# Patient Record
Sex: Male | Born: 1945 | Race: White | Hispanic: No | Marital: Married | Smoking: Never smoker
Health system: Southern US, Community
[De-identification: ages and names within clinical notes are randomized; demographics above are authoritative.]

---

## 2013-09-19 ENCOUNTER — Ambulatory Visit: Payer: Self-pay | Admitting: Family Medicine

## 2014-05-24 IMAGING — CR DG CHEST 2V
1 series · 2 of 2 positions shown · non-contrast
Comparison: none

REASON FOR EXAM: cough persistent cough for 4wks and sounds in chest
COMMENTS:

[Series 1: pa · 0.17mm/px · 2 of 2 slices shown]
[im 1/2]
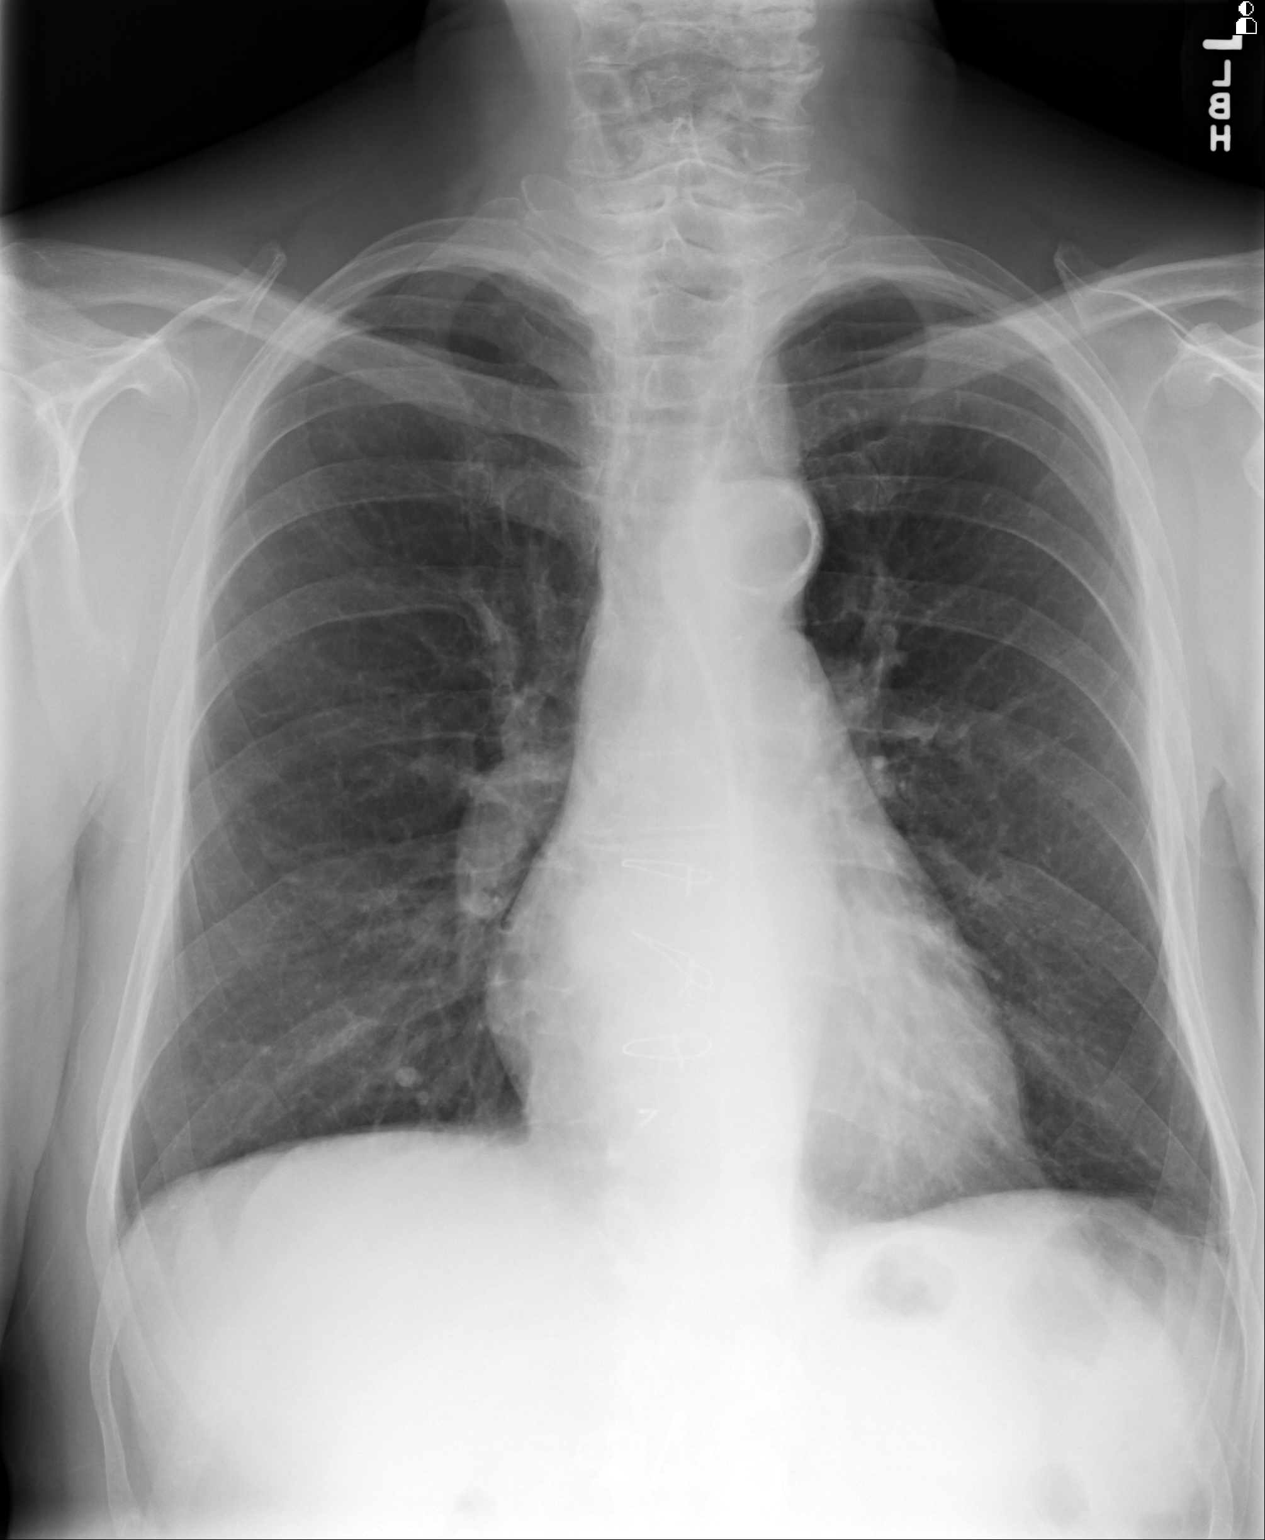
[im 2/2]
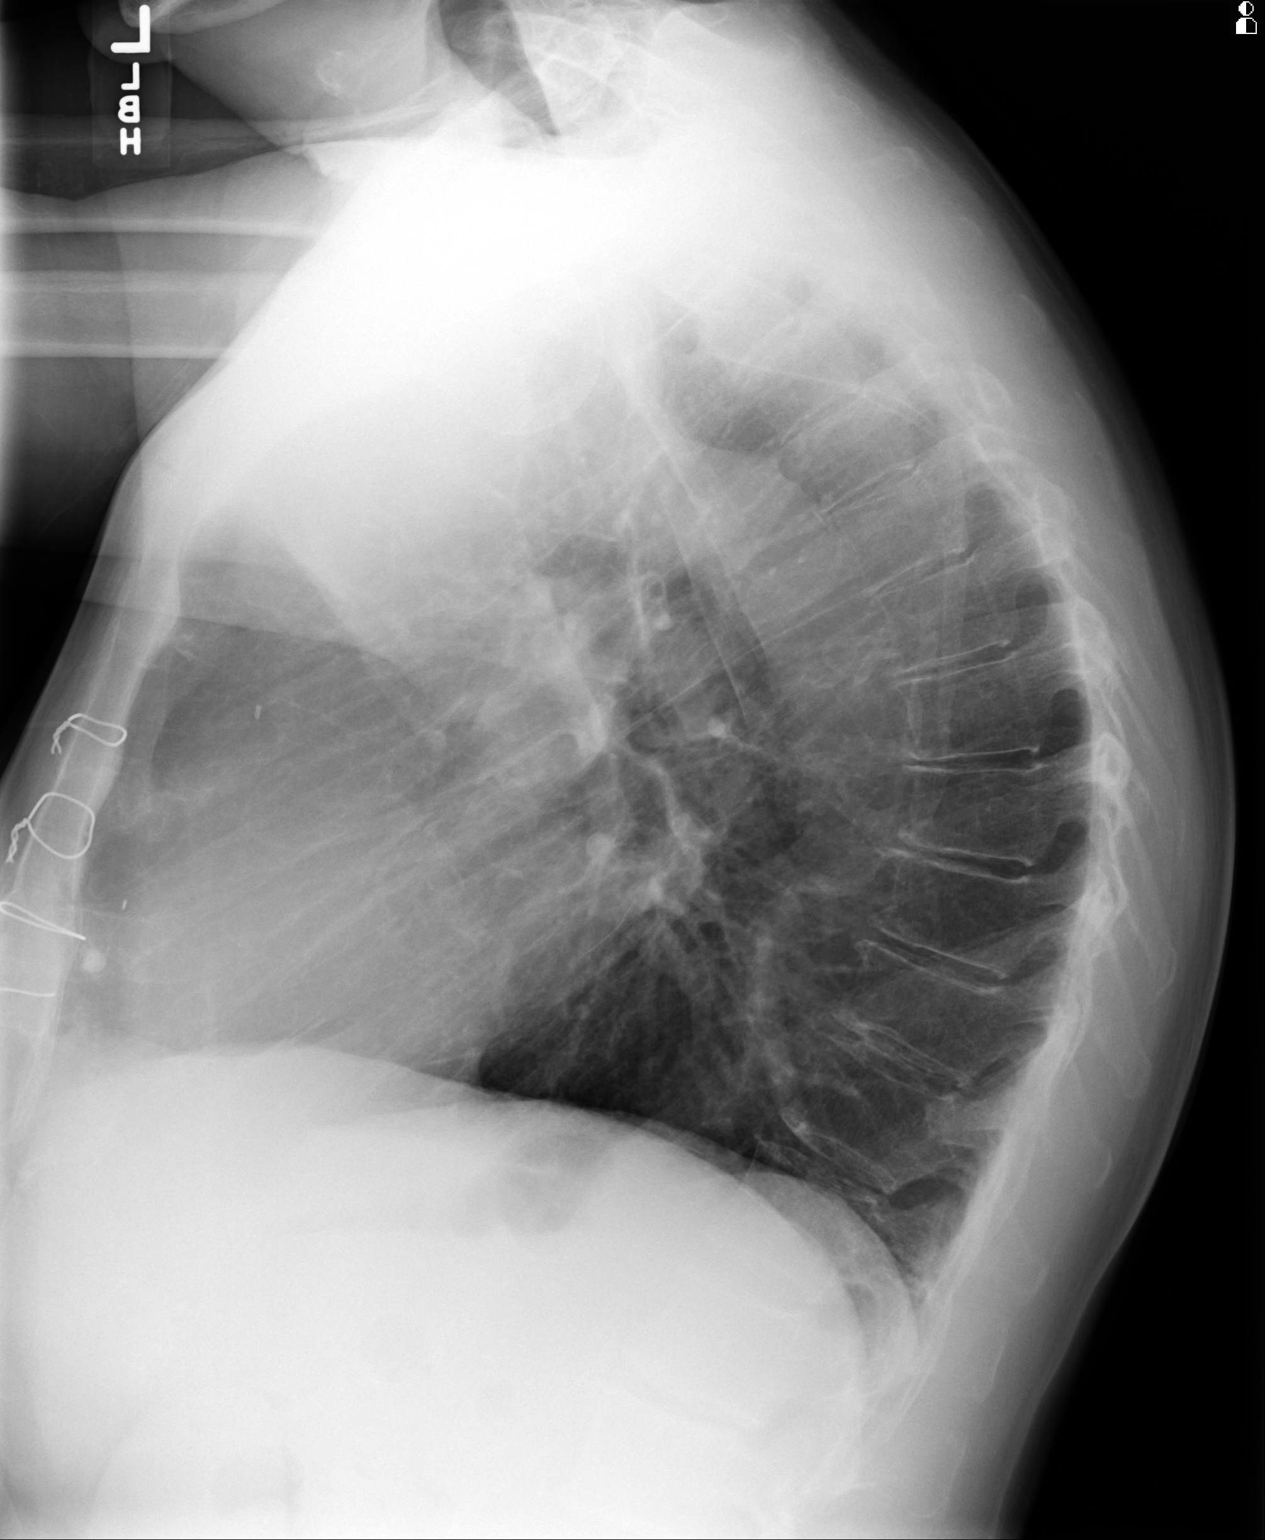

[2 of 2 positions shown; findings below may reference images not displayed]

PROCEDURE:     MDR - MDR CHEST PA(OR AP) AND LATERAL  - September 19, 2013  [DATE]

RESULT:     The lungs are mildly hyperinflated and clear. The cardiac
silhouette is normal in size. The mediastinum is normal in width. There is
no pleural effusion or pneumothorax. The bony thorax exhibits no acute
abnormality where visualized. The patient has undergone previous median
sternotomy.
IMPRESSION: There is mild hyperinflation which may be voluntary or
could reflect underlying COPD or reactive airway disease. There is no
evidence of pneumonia nor CHF.

[REDACTED]

## 2017-08-12 ENCOUNTER — Ambulatory Visit: Payer: Medicare HMO | Attending: Internal Medicine

## 2017-08-12 DIAGNOSIS — M79642 Pain in left hand: Secondary | ICD-10-CM | POA: Insufficient documentation

## 2017-08-12 DIAGNOSIS — M79641 Pain in right hand: Secondary | ICD-10-CM | POA: Insufficient documentation

## 2017-08-12 DIAGNOSIS — M542 Cervicalgia: Secondary | ICD-10-CM | POA: Diagnosis not present

## 2017-08-12 NOTE — Therapy (Signed)
[facility] PHYS[facility]ICINE [address] [place], [place], [address] Phone: [phone]   Fax:  [phone]  Physical Therapy Evaluation  Patient Details  Name: Benjamin [name] MRN: [id number] Date of Birth: 29-Apr-1946 Referring Provider: Dr. [place] BlasMoodey  Encounter Date: 08/12/2017      PT End of Session - 08/12/17 0942    Visit Number 1   Number of Visits 13   Date for PT Re-Evaluation 09/23/17   PT Start Time 0830   PT Stop Time 0930   PT Time Calculation (min) 60 min   Activity Tolerance Patient tolerated treatment well   Behavior [facility] assessed/performed      History reviewed. No pertinent past medical history.  History reviewed. No pertinent surgical history.  There were no vitals filed for this visit.      Subjective Assessment - 08/12/17 0846    Subjective Patienty reports increased pain along the L side of the neck and numbness and tingling into the R & L hand (digits 1-3). Patient reports aggravation in symptoms with typing, gardening, sleeping (can only sleep on L side), and grabbing cold items. Patient reports decreased in symptoms with heat, yoga, and cervical extension. Patient reports incresaed pain at night. Patient reports slight worsening of symptoms since onset but has recently started to improve after applying voltaren cream.    Pertinent History PMH: x3 mitral valve replacement, history of anemia,    Limitations Lifting   How long can you sit comfortably? Driving: increase in pain after driving 50 miles   Diagnostic tests X-Ray: shortening of spaces   Currently in Pain? Yes   Pain Score 0-No pain  worst: 8/10; best 0/10   Pain Location Neck   Pain Orientation Left;Right   Pain Descriptors / Indicators Numbness;Burning  Dull Ache in the neck   Pain Type Chronic pain   Pain Radiating Towards Into B digits 1-3 on the R & L   Pain Onset More than a month ago   Pain Frequency Intermittent             [facility] PT Assessment - 08/12/17 0841      Assessment   Medical Diagnosis Cervical radiculopathy   Referring Provider Dr. [place] BlasMoodey   Onset Date/Surgical Date 01/29/17   Hand Dominance Right   Next MD Visit 09/08/2017   Prior Therapy yes     Balance Screen   Has the patient fallen in the past 6 months No   Has the patient had a decrease in activity level because of a fear of falling?  No   Is the patient reluctant to leave their home because of a fear of falling?  No     Home [employer]vironment   Living Environment Private residence   Living Arrangements Spouse/significant other   Available Help at Discharge Family   Type of Home House   Home Access Stairs to enter   Entrance Stairs-Number of Steps 7   Entrance Stairs-Rails Can reach both   Home Layout Two level   Alternate Level Stairs-Number of Steps 15   Alternate Level Stairs-Rails Can reach both     Prior Function   Level of Independence Independent   Vocation Full time employment   Vocation Requirements typing, driving   Leisure Garden, theature     Cognition   Overall Cognitive Status Within Functional Limits for tasks assessed     Observation/Other Assessments   Observations Poor scapular retraction mobility  Other Surveys  Other Surveys   Neck Disability Index  6%     Sensation   Light Touch Appears Intact     Posture/Postural Control   Posture Comments Increased thoracic kyphosis, forward rounded shoulders, FHP     ROM / Strength   AROM / PROM / Strength AROM;Strength     AROM   AROM Assessment Site Cervical;Thoracic  Shoulder WNL   Cervical Flexion 50   Cervical Extension 30   Cervical - Right Side Bend 20  pain on the L   Cervical - Left Side Bend 20   Cervical - Right Rotation 55   Cervical - Left Rotation 35   Thoracic Flexion WNL   Thoracic Extension 50% limited     Strength   Strength Assessment Site Cervical   Cervical Flexion 4+/5   Cervical Extension 4-/5   Cervical - Right  Side Bend 4-/5   Cervical - Left Side Bend 4-/5     Palpation   Spinal mobility L Sided: hypomobility unilat C3-T1   Palpation comment Increased TTP along the L upper trap     Special Tests    Special Tests Cervical   Cervical Tests Spurling's     Spurling's   Findings Negative     Median Nerve ULTT: Increased onset of symptoms on the L with extending the elbow   TREATMENT: Therapeutic Exercise Scapular retraction in sitting -- x 20  Median Nerve Sliders -- x 20 B Cervical retraction in sitting -- x 20 B    Patient demonstrates improved scapular mobility after performing greater amount of repetitions       PT Education - 08/12/17 0942    Education provided Yes   Education Details HEP: scapular retraction, cervical retraction, median nerve sliders   Person(s) Educated Patient   Methods Explanation;Demonstration;Handout   Comprehension Verbalized understanding;Returned demonstration             PT Long Term Goals - 08/12/17 1327      PT LONG TERM GOAL #1   Title Patient will be independent with HEP performance to continue benefit of therapy after discharge   Baseline Dependent with exercise performance and technique   Time 6   Period Weeks   Status New   Target Date 09/23/17     PT LONG TERM GOAL #2   Title Patient will improve NDI to 0% to improve self- perceived neck dysfunction and decrease neck pain with activity.   Baseline NDI: 6%   Time 6   Period Weeks   Status New   Target Date 09/23/17     PT LONG TERM GOAL #3   Title Patient will have a worst pain of 0/10 in his neck and hands to improve resting comfort at home most notably at the end of the day.   Baseline 8/10 in his hands   Time 6   Period Weeks   Status New   Target Date 09/23/17               Plan - 08/12/17 0943    Clinical Impression Statement Patient is a 71 yo right hand dominant male presenting with increased L sided shoulder pain with radiating symptoms into the 1-3  digits bilaterally. Patient demonstrates increased cervical pain and numbness/tingling/burning with performance of manipulation of small objects, driving, typing, and positional changes. These changes have caused increased difficulty with job performance and sleeping. Patient demonstrates decreased AROM and mobility in the cervical spine and pt will benefit from further  skilled therapy to return to prior level of function.    History and Personal Factors relevant to plan of care: Chronic pain, radiating pain, increased demand of driving   Clinical Presentation Stable   Clinical Presentation due to: Patient demonstrates no increase in pain   Clinical Decision Making Moderate   Rehab Potential Fair   Clinical Impairments Affecting Rehab Potential (+) highly motivated, (-) chronicity of pain   PT Frequency 2x / week   PT Duration 6 weeks   PT Treatment/Interventions Gait training;Electrical Stimulation;Iontophoresis /ml Dexamethasone;ADLs/Self Care Home Management;Ultrasound;Moist Heat;Balance training;Therapeutic exercise;Fluidtherapy;Therapeutic activities;Functional mobility training;Stair training;Neuromuscular re-education;Patient/family education;Passive range of motion;Manual techniques;Dry needling   PT Next Visit Plan Progress strengthening and AROM   PT Home Exercise Plan see education section   Consulted and Agree with Plan of Care Patient      Patient will benefit from skilled therapeutic intervention in order to improve the following deficits and impairments:  Pain, Decreased mobility, Decreased coordination, Increased muscle spasms, Decreased endurance, Decreased range of motion, Decreased strength, Decreased activity tolerance, Decreased balance, Hypomobility  Visit Diagnosis: Cervicalgia - Plan: PT plan of care cert/re-cert  Pain in left hand - Plan: PT plan of care cert/re-cert  Pain in right hand - Plan: PT plan of care cert/re-cert       G-Codes - 08/18/17 1339     Functional Assessment Tool Used (Outpatient Only) NDI, AROM, MMT    Functional Limitation Changing and maintaining body position   Changing and Maintaining Body Position Current Status (Z6109) At least 1 percent but less than 20 percent impaired, limited or restricted   Changing and Maintaining Body Position Goal Status (U0454) At least 1 percent but less than 20 percent impaired, limited or restricted      Problem List There are no active problems to display for this patient.   Myrene Galas, PT DPT 08/18/2017, 4:58 PM   [facility] PHYSICAL AND SPORTS MEDICINE [address], [place], [address] Phone: [phone]   Fax:  [phone]  Name: Benjamin [name] MRN: [id number] Date of Birth: Sep 16, 1946

## 2017-08-17 ENCOUNTER — Ambulatory Visit: Payer: Medicare HMO

## 2017-08-17 DIAGNOSIS — M542 Cervicalgia: Secondary | ICD-10-CM | POA: Diagnosis not present

## 2017-08-17 DIAGNOSIS — M79641 Pain in right hand: Secondary | ICD-10-CM

## 2017-08-17 DIAGNOSIS — M79642 Pain in left hand: Secondary | ICD-10-CM

## 2017-08-17 NOTE — Therapy (Signed)
[facility] PHYS[facility]ORTS MEDICINE [address] [place], [address] Phone: [phone]   Fax:  [phone]  Physical Therapy Treatment  Patient Details  Name: Benjamin [name] MRN: [id number] Date of Birth: 05-23-1946 Referring Provider: Dr.  Blas  Encounter Date: 08/17/2017      PT End of Session - 08/17/17 0858    Visit Number 2   Number of Visits 13   Date for PT Re-Evaluation 09/23/17   PT Start Time 0827   PT Stop Time 0915   PT Time Calculation (min) 48 min   Activity Tolerance Patient tolerated treatment well   Behavior During Therapy [facility] for tasks assessed/performed      History reviewed. No pertinent past medical history.  History reviewed. No pertinent surgical history.  There were no vitals filed for this visit.      Subjective Assessment - 08/17/17 0828    Subjective Patinet reports he's been performing his HEP. Patient states the pain has been mild and comes and goes.    Pertinent History PMH: x3 mitral valve replacement, history of anemia,    How long can you sit comfortably? Driving: increase in pain after driving 50 miles   Diagnostic tests X-Ray: shortening of spaces   Currently in Pain? No/denies      TREATMENT: Manual Therapy:  STM to patient's L upper traps and cervical extensor musculature with patient positioned in sitting to decrease increased pain and spasms  Therapeutic Exercise: Overhead shoulder ADD with YTB - 2 x 20  Sitting shoulder ER with YTB - 2 x 20  Standing scapular retraction with TRX - 2 x 20  Standing scapular retraction at OMEGA - 2 x 20 #15 Standing straight arm push downs at OMEGA - 2 x 20 15# Seated High row at [facility] - 2 x 20 25# Seated thoracic extension over the towel - 2 x 20 with feet supported on step Shoulder flexion with yellow physioball - x 20  Push up PLUS at wall with hand at wall - x 20          PT Education - 08/17/17 0838    Education provided Yes   Education Details form/technique with exercise   Person(s) Educated Patient   Methods Explanation;Demonstration   Comprehension Verbalized understanding;Returned demonstration             PT Long Term Goals - 08/12/17 1327      PT LONG TERM GOAL #1   Title Patient will be independent with HEP performance to continue benefit of therapy after discharge   Baseline Dependent with exercise performance and technique   Time 6   Period Weeks   Status New   Target Date 09/23/17     PT LONG TERM GOAL #2   Title Patient will improve NDI to 0% to improve self- perceived neck dysfunction and decrease neck pain with activity.   Baseline NDI: 6%   Time 6   Period Weeks   Status New   Target Date 09/23/17     PT LONG TERM GOAL #3   Title Patient will have a worst pain of 0/10 in his neck and hands to improve resting comfort at home most notably at the end of the day.   Baseline 8/10 in his hands   Time 6   Period Weeks   Status New   Target Date 09/23/17               Plan - 08/17/17 6962  Clinical Impression Statement Patient demonstrates poor motor control with scapular/cervical motion requiring frequent cueing on proper positioning throughout session. Patient demonstrates improvement with radicular symptoms after performing AROM exercises indicating joint dysfunction. Patient will benefit from further skilled therapy to return to prior level of function.    Rehab Potential Fair   Clinical Impairments Affecting Rehab Potential (+) highly motivated, (-) chronicity of pain   PT Frequency 2x / week   PT Duration 6 weeks   PT Treatment/Interventions Gait training;Electrical Stimulation;Iontophoresis /ml Dexamethasone;ADLs/Self Care Home Management;Ultrasound;Moist Heat;Balance training;Therapeutic exercise;Fluidtherapy;Therapeutic activities;Functional mobility training;Stair training;Neuromuscular re-education;Patient/family education;Passive range of motion;Manual  techniques;Dry needling   PT Next Visit Plan Progress strengthening and AROM   PT Home Exercise Plan see education section   Consulted and Agree with Plan of Care Patient      Patient will benefit from skilled therapeutic intervention in order to improve the following deficits and impairments:  Pain, Decreased mobility, Decreased coordination, Increased muscle spasms, Decreased endurance, Decreased range of motion, Decreased strength, Decreased activity tolerance, Decreased balance, Hypomobility  Visit Diagnosis: Cervicalgia  Pain in left hand  Pain in right hand     Problem List There are no active problems to display for this patient.   Myrene Galas, PT DPT 08/17/2017, 9:20 AM  [place] [facility] PHYSICAL AND SPORTS MEDICINE [address] [place], [address] Phone: [phone]   Fax:  [phone]  Name: Benjamin [name] MRN: [id number] Date of Birth: August 18, 1946

## 2017-08-18 ENCOUNTER — Ambulatory Visit: Payer: Medicare HMO

## 2017-08-19 ENCOUNTER — Ambulatory Visit: Payer: Medicare HMO

## 2017-08-20 ENCOUNTER — Ambulatory Visit: Payer: Medicare HMO

## 2017-08-20 DIAGNOSIS — M79642 Pain in left hand: Secondary | ICD-10-CM

## 2017-08-20 DIAGNOSIS — M79641 Pain in right hand: Secondary | ICD-10-CM

## 2017-08-20 DIAGNOSIS — M542 Cervicalgia: Secondary | ICD-10-CM

## 2017-08-20 NOTE — Therapy (Signed)
Pt enters/exits pool via steps Participates in exercises as follows: 4 laps fwd walk with kick boards under arms for support, focus on upright posture, head aligned, core strength Core strength and upper body range/strength with: - shoulder press downs, green dum bells, 20x - horizontal abd/add, 20x - Shoulder flex/ext, 20x - shoulder abd/add, 20x - bkwd shoulder rolls, focus on back and down with hold, 20x - single arm figure 8's, 20x each - small arm circles, fwd/bkwd, 20x ea Stretching: -B upper trap, 3 x 15 sec each -B SCM/rotation, 3 x 15 sec each

## 2017-08-25 ENCOUNTER — Ambulatory Visit: Payer: Medicare HMO

## 2017-08-26 ENCOUNTER — Ambulatory Visit: Payer: Medicare HMO

## 2017-08-26 DIAGNOSIS — M79641 Pain in right hand: Secondary | ICD-10-CM

## 2017-08-26 DIAGNOSIS — M542 Cervicalgia: Secondary | ICD-10-CM | POA: Diagnosis not present

## 2017-08-26 DIAGNOSIS — M79642 Pain in left hand: Secondary | ICD-10-CM

## 2017-08-26 NOTE — Therapy (Signed)
[facility] PHYSICAL [facility] SPORTS MEDICINE [address] [place], [address] Phone: [phone]   Fax:  [phone]  Physical Therapy Treatment  Patient Details  Name: Benjamin [name] MRN: [id number] Date of Birth: 07-13-1946 Referring Provider: Dr. [place] Blas  Encounter Date: 08/26/2017      PT End of Session - 08/26/17 0838    Visit Number 4   Number of Visits 13   Date for PT Re-Evaluation 09/23/17   PT Start Time 0830   PT Stop Time 0915   PT Time Calculation (min) 45 min   Activity Tolerance Patient tolerated treatment well   Behavior During Therapy [facility] for tasks assessed/performed      History reviewed. No pertinent past medical history.  History reviewed. No pertinent surgical history.  There were no vitals filed for this visit.      Subjective Assessment - 08/26/17 0835    Subjective Pt notes improvement with land and water therapy and states his pain and symptoms has 90% improvement.    Currently in Pain? No/denies      TREATMENT:   Therapeutic Exercise: Standing straight arm push downs at OMEGA - 2 x 20 15# Seated High row at OMEGA - 2 x 20 25# Standing Scapular retraction at OMEGA - 2 x 20 15# Standing scapular retraction with TRX - 2 x 20  Standing Shoulder extension with TRX -  2 x 10  Corner Stretch to improve scapular ROM - x20 with 2 sec holds Wall angels while facing wall and back against wall  - x 10 Seated prayer stretch with clear physioball - x 15 Scapular retraction in standing at doorway -- 2x20 Serratus punches at wall -- x 20 with manual/tactile cues   Patient demonstrates increased fatigue at end of therapy        PT Education - 08/26/17 0838    Education provided Yes   Education Details form/technique with exercise   Person(s) Educated Patient   Methods Explanation;Demonstration   Comprehension Verbalized understanding;Returned demonstration             PT Long Term Goals - 08/20/17  1421      PT LONG TERM GOAL #1   Title Patient will be independent with HEP performance to continue benefit of therapy after discharge   Baseline Dependent with exercise performance and technique     PT LONG TERM GOAL #2   Title Patient will improve NDI to 0% to improve self- perceived neck dysfunction and decrease neck pain with activity.   Baseline NDI: 6%   Time 6   Period Weeks   Status New     PT LONG TERM GOAL #3   Title Patient will have a worst pain of 0/10 in his neck and hands to improve resting comfort at home most notably at the end of the day.   Baseline 8/10 in his hands   Time 6   Period Weeks   Status New               Plan - 08/26/17 2536    Clinical Impression Statement Continued to focus on improving scapular and cervical strengthening and coordination today with exercise. Patient demonstrates improvement with exercise performance with ability to tolarate advancement in exercise without increase in symptoms. Patient continues to require minimal requirement on shoulder positioning indcating decreased motor control and patient will benefit from further skilled therapy to return to prior level of function.    Rehab Potential Fair  Clinical Impairments Affecting Rehab Potential (+) highly motivated, (-) chronicity of pain   PT Frequency 2x / week   PT Duration 6 weeks   PT Treatment/Interventions Gait training;Electrical Stimulation;Iontophoresis /ml Dexamethasone;ADLs/Self Care Home Management;Ultrasound;Moist Heat;Balance training;Therapeutic exercise;Fluidtherapy;Therapeutic activities;Functional mobility training;Stair training;Neuromuscular re-education;Patient/family education;Passive range of motion;Manual techniques;Dry needling   PT Next Visit Plan Progress strengthening and AROM   PT Home Exercise Plan see education section   Consulted and Agree with Plan of Care Patient      Patient will benefit from skilled therapeutic intervention in order to  improve the following deficits and impairments:  Pain, Decreased mobility, Decreased coordination, Increased muscle spasms, Decreased endurance, Decreased range of motion, Decreased strength, Decreased activity tolerance, Decreased balance, Hypomobility  Visit Diagnosis: Pain in left hand  Cervicalgia  Pain in right hand     Problem List There are no active problems to display for this patient.   Myrene Galas, PT DPT 08/26/2017, 9:03 AM  [place] [facility] PHYSICAL AND SPORTS MEDICINE [address] [place], [address] Phone: [phone]   Fax:  [phone]  Name: ASEEM SESSUMS MRN: [id number] Date of Birth: 08-20-1946

## 2017-08-31 ENCOUNTER — Ambulatory Visit: Payer: Medicare HMO | Attending: Internal Medicine

## 2017-08-31 DIAGNOSIS — M79641 Pain in right hand: Secondary | ICD-10-CM

## 2017-08-31 DIAGNOSIS — M79642 Pain in left hand: Secondary | ICD-10-CM

## 2017-08-31 DIAGNOSIS — M542 Cervicalgia: Secondary | ICD-10-CM | POA: Diagnosis not present

## 2017-08-31 NOTE — Therapy (Signed)
[place] [facility] PHYS[facility]ORTS MEDICINE [address], [place], [address] Phone: [phone]   Fax:  [phone]  Physical Therapy Treatment  Patient Details  Name: Benjamin [name] MRN: [id number] Date of Birth: 12/25/1945 Referring Provider: Dr. [place] Blas  Encounter Date: 08/31/2017      PT End of Session - 08/31/17 0852    Visit Number 5   Number of Visits 13   Date for PT Re-Evaluation 09/23/17   PT Start Time 0830   PT Stop Time 0915   PT Time Calculation (min) 45 min   Activity Tolerance Patient tolerated treatment well   Behavior During Therapy [facility] for tasks assessed/performed      History reviewed. No pertinent past medical history.  History reviewed. No pertinent surgical history.  There were no vitals filed for this visit.      Subjective Assessment - 08/31/17 0846    Subjective Patient reports imporved ability to button up his shirt without difficulty in standing. Patient states he only seldomly gets symptoms.    Pertinent History PMH: x3 mitral valve replacement, history of anemia,    Limitations Lifting   How long can you sit comfortably? Driving: increase in pain after driving 50 miles   Diagnostic tests X-Ray: shortening of spaces   Currently in Pain? No/denies        TREATMENT:   Therapeutic Exercise: Nerve sliders in standing median nerve bias - x25 Standing scapular retraction with TRX - 2 x 20  Standing Shoulder extension with TRX - 2 x 15  Standing straight arm push downs at OME[facility] - 2 x 20 20# Seated High row at [facility] - 2 x 20 25# Overhead ball taps at wall with 2kg ball - 2 x 15  Ball rolling in circles at the wall - cw/cww 20sec x 2 each direction  Wall angels with back against wall  -2 x 10 Standing facing wall shoulder ER with YTB around wrists - 2 x 10  Standing Scapular retraction at [facility] - 2 x 20 20# Corner Stretch to improve scapular ROM - x25 with 1 sec holds   Patient demonstrates increased  fatigue at end of therapy         PT Education - 08/31/17 0851    Education provided Yes   Education Details form/technique with exercise   Person(s) Educated Patient   Methods Explanation;Demonstration   Comprehension Verbalized understanding;Returned demonstration             PT Long Term Goals - 08/20/17 1421      PT LONG TERM GOAL #1   Title Patient will be independent with HEP performance to continue benefit of therapy after discharge   Baseline Dependent with exercise performance and technique     PT LONG TERM GOAL #2   Title Patient will improve NDI to 0% to improve self- perceived neck dysfunction and decrease neck pain with activity.   Baseline NDI: 6%   Time 6   Period Weeks   Status New     PT LONG TERM GOAL #3   Title Patient will have a worst pain of 0/10 in his neck and hands to improve resting comfort at home most notably at the end of the day.   Baseline 8/10 in his hands   Time 6   Period Weeks   Status New               Plan - 08/31/17 0857    Clinical Impression  Statement Continued to advance exercises in standing to improve scapular stabilization and improvement in motor control. Patient continues to demonstrate improvement with exercise performance with ability to tolerate advancement in exercises without incresae in symptoms; however he continues to require cueing throughout therapy to decrease shoulder elevation. Patient will benefit from further skilled therapy to return to prior level of function.    Rehab Potential Fair   Clinical Impairments Affecting Rehab Potential (+) highly motivated, (-) chronicity of pain   PT Frequency 2x / week   PT Duration 6 weeks   PT Treatment/Interventions Gait training;Electrical Stimulation;Iontophoresis /ml Dexamethasone;ADLs/Self Care Home Management;Ultrasound;Moist Heat;Balance training;Therapeutic exercise;Fluidtherapy;Therapeutic activities;Functional mobility training;Stair  training;Neuromuscular re-education;Patient/family education;Passive range of motion;Manual techniques;Dry needling   PT Next Visit Plan Progress strengthening and AROM   PT Home Exercise Plan see education section   Consulted and Agree with Plan of Care Patient      Patient will benefit from skilled therapeutic intervention in order to improve the following deficits and impairments:  Pain, Decreased mobility, Decreased coordination, Increased muscle spasms, Decreased endurance, Decreased range of motion, Decreased strength, Decreased activity tolerance, Decreased balance, Hypomobility  Visit Diagnosis: Cervicalgia  Pain in left hand  Pain in right hand     Problem List There are no active problems to display for this patient.   Myrene Galas, PT DPT 08/31/2017, 9:17 AM  [place] [facility] PHYSICAL AND SPORTS MEDICINE [address] [place], [address] Phone: [phone]   Fax:  [phone]  Name: Benjamin [name] MRN: [id number] Date of Birth: [birth year]

## 2017-09-01 ENCOUNTER — Ambulatory Visit: Payer: Medicare HMO

## 2017-09-03 ENCOUNTER — Ambulatory Visit: Payer: Medicare HMO

## 2017-09-03 DIAGNOSIS — M542 Cervicalgia: Secondary | ICD-10-CM | POA: Diagnosis not present

## 2017-09-03 DIAGNOSIS — M79642 Pain in left hand: Secondary | ICD-10-CM

## 2017-09-03 DIAGNOSIS — M79641 Pain in right hand: Secondary | ICD-10-CM

## 2017-09-03 NOTE — Therapy (Signed)
Pt enters/exits pool via steps Participates in exercises as follows:  Ambulation: - fwd, sidestep with arms rested 4 laps each  Scapular/shoulder exercise with core stabilization, 20x - horizontal abd/add - flex/ext - abd/add - triceps press down with green dumbbells  - bkwd bicycle, 4 min - figure 8's single arm - overhead ball lift, B hands  Planks - fwd, B sides; 3 x 20 seconds each  Upper trap stretching, 3 x 15 seconds each side  Slow walking with arms, fwd/side 4 laps each

## 2017-09-07 ENCOUNTER — Ambulatory Visit: Payer: Medicare HMO

## 2017-09-07 DIAGNOSIS — M542 Cervicalgia: Secondary | ICD-10-CM

## 2017-09-07 DIAGNOSIS — M79641 Pain in right hand: Secondary | ICD-10-CM

## 2017-09-07 DIAGNOSIS — M79642 Pain in left hand: Secondary | ICD-10-CM

## 2017-09-07 NOTE — Therapy (Signed)
[place] [facility] PHYSICAL [facility] SPORTS MEDICINE [address] [place], [address] Phone: [phone]   Fax:  [phone]  Physical Therapy Treatment  Patient Details  Name: Benjamin [name] MRN: [id number] Date of Birth: 30-May-1946 Referring Provider: Dr. [place] Blas  Encounter Date: 09/07/2017      PT End of Session - 09/07/17 1527    Visit Number 7   Number of Visits 13   Date for PT Re-Evaluation 09/23/17   PT Start Time 1515   PT Stop Time 1600   PT Time Calculation (min) 45 min   Activity Tolerance Patient tolerated treatment well   Behavior During Therapy [facility] for tasks assessed/performed      History reviewed. No pertinent past medical history.  History reviewed. No pertinent surgical history.  There were no vitals filed for this visit.      Subjective Assessment - 09/07/17 1520    Subjective Patient reports no current pain and states he was able to do more activities of daily living around the yard without increase in pain.    Currently in Pain? No/denies         TREATMENT:   Therapeutic Exercise: Standing UE ergometer - 45sec in each direction for 6 min total (resistance level 120) Standing scapular retraction with TRX - 2 x 20  Standing Shoulder extension with TRX - 2 x 20  Standing ER with GTB - x 20  Overhead shoulder adduction with GTB - x 20  Wall angels with back against wall - 2 x 10 Push up plus at Wall - 2 x 20 Seated ball push outs with prayer stretch - x 3 min  Corner Stretch to improve scapular ROM - x25 with 1 sec holds Standing overhead ball taps in standing - 2 x 25 2kg    Patient demonstrates increased fatigue at end of therapy      PT Education - 09/07/17 1526    Education provided Yes   Education Details form/technique with exercises   Person(s) Educated Patient   Methods Explanation;Demonstration   Comprehension Verbalized understanding;Returned demonstration             PT Long Term Goals  - 09/03/17 1321      PT LONG TERM GOAL #1   Title Patient will be independent with HEP performance to continue benefit of therapy after discharge   Baseline Dependent with exercise performance and technique     PT LONG TERM GOAL #2   Title Patient will improve NDI to 0% to improve self- perceived neck dysfunction and decrease neck pain with activity.   Baseline NDI: 6%   Time 6   Period Weeks   Status New     PT LONG TERM GOAL #3   Title Patient will have a worst pain of 0/10 in his neck and hands to improve resting comfort at home most notably at the end of the day.   Baseline 8/10 in his hands   Time 6   Period Weeks   Status New               Plan - 09/07/17 1541    Clinical Impression Statement Patient demonstrates improvement in exercise performance with ability to perform greater amount of exercises without as much instruction as previous visits. Patient although patient's technique is improving, he continues to demonstrates poor motor control most notebaly with serratus/overhead activity and will benefit from further skilled therapy focused on improving limitations to return to prior level of  function.    Rehab Potential Fair   Clinical Impairments Affecting Rehab Potential (+) highly motivated, (-) chronicity of pain   PT Frequency 2x / week   PT Duration 6 weeks   PT Treatment/Interventions Gait training;Electrical Stimulation;Iontophoresis /ml Dexamethasone;ADLs/Self Care Home Management;Ultrasound;Moist Heat;Balance training;Therapeutic exercise;Fluidtherapy;Therapeutic activities;Functional mobility training;Stair training;Neuromuscular re-education;Patient/family education;Passive range of motion;Manual techniques;Dry needling   PT Next Visit Plan Progress strengthening and AROM   PT Home Exercise Plan see education section   Consulted and Agree with Plan of Care Patient      Patient will benefit from skilled therapeutic intervention in order to improve the  following deficits and impairments:  Pain, Decreased mobility, Decreased coordination, Increased muscle spasms, Decreased endurance, Decreased range of motion, Decreased strength, Decreased activity tolerance, Decreased balance, Hypomobility  Visit Diagnosis: Cervicalgia  Pain in left hand  Pain in right hand     Problem List There are no active problems to display for this patient.   Myrene Galas, PT DPT 09/07/2017, 3:54 PM  [place] [address] [facility] PHYSICAL AND SPORTS MEDICINE [address] [place], [address] Phone: [phone]   Fax:  [phone]  Name: DESHAY BLUMENFELD MRN: [id number] Date of Birth: 01/20/1946

## 2017-09-08 ENCOUNTER — Ambulatory Visit: Payer: Medicare HMO

## 2017-09-08 DIAGNOSIS — M79641 Pain in right hand: Secondary | ICD-10-CM

## 2017-09-08 DIAGNOSIS — M79642 Pain in left hand: Secondary | ICD-10-CM

## 2017-09-08 DIAGNOSIS — M542 Cervicalgia: Secondary | ICD-10-CM

## 2017-09-08 NOTE — Therapy (Signed)
Pt enters/exits pool via steps Participates in the following exercises  Ambulation with good posture, shoulders back and down, arm swing - 4 L fwd - 4 L side  Scapular stabilization exercises (with core stab as well), 20x each - sh flex/ext - sh abd/add - sh horiz abd/add - triceps press down, green dumbbells - B single arm bkwd bicycle - B single arm figure 8's   Bench scoot, activating lats/low trap, 2 L  Planks, 3 x 20 sec each - fwd - B side  Stretching, B 3 x 15 seconds each - Pec  - Up traps

## 2017-09-10 ENCOUNTER — Ambulatory Visit: Payer: Medicare HMO

## 2017-09-14 ENCOUNTER — Ambulatory Visit: Payer: Medicare HMO

## 2017-09-14 DIAGNOSIS — M79641 Pain in right hand: Secondary | ICD-10-CM

## 2017-09-14 DIAGNOSIS — M79642 Pain in left hand: Secondary | ICD-10-CM

## 2017-09-14 DIAGNOSIS — M542 Cervicalgia: Secondary | ICD-10-CM | POA: Diagnosis not present
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
## 2017-09-17 ENCOUNTER — Ambulatory Visit: Payer: Medicare HMO

## 2017-09-17 DIAGNOSIS — M542 Cervicalgia: Secondary | ICD-10-CM | POA: Diagnosis not present

## 2017-09-17 DIAGNOSIS — M79642 Pain in left hand: Secondary | ICD-10-CM

## 2017-09-17 DIAGNOSIS — M79641 Pain in right hand: Secondary | ICD-10-CM

## 2017-09-17 NOTE — Therapy (Signed)
Pt enters/exits pool via steps Participates in the following Ambulation - 4 L fwd with arms, good posture - 4 L side with arms, good posture  Wall sit abdominal stab UEs, 25x each - triceps press down, green dumbells - sh flex/ext - sh abd/add - sh horiz abd/add  Scap stab exercises, 20x each - Ball overhead lift - arm circles, fwd/side - single arm bkwd shoulder roll (emphasize retr/depression) - single arm figure 8s  Bench scoot with retraction/depression 2L  Stretches, 3 x 15 seconds each - upper trap - scalenes - cx rotation stretch - Standing pec  Educated on ER position of UEs with static sit times, supine before bed.

## 2017-09-21 ENCOUNTER — Ambulatory Visit: Payer: Medicare HMO

## 2017-09-21 DIAGNOSIS — M79642 Pain in left hand: Secondary | ICD-10-CM

## 2017-09-21 DIAGNOSIS — M79641 Pain in right hand: Secondary | ICD-10-CM

## 2017-09-21 DIAGNOSIS — M542 Cervicalgia: Secondary | ICD-10-CM | POA: Diagnosis not present

## 2017-09-21 NOTE — Therapy (Signed)
[place] [facility] PHYSICAL [facility] SPORTS MEDICINE [address] [place], [address] Phone: [phone]   Fax:  [phone]  Physical Therapy Treatment  Patient Details  Name: Benjamin [name] MRN: [id number] Date of Birth: [birth year] Referring Provider: Dr. [place] Blas  Encounter Date: 09/21/2017      PT End of Session - 09/21/17 0859    Visit Number 11   Number of Visits 13   Date for PT Re-Evaluation 10/19/17   PT Start Time 0830   PT Stop Time 0915   PT Time Calculation (min) 45 min   Activity Tolerance Patient tolerated treatment well   Behavior During Therapy [facility] for tasks assessed/performed      History reviewed. No pertinent past medical history.  History reviewed. No pertinent surgical history.  There were no vitals filed for this visit.      Subjective Assessment - 09/21/17 0852    Subjective Patient reports he's been improving the progress of therapy and states he is currently not having any pain in his shoulders.    Currently in Pain? No/denies        TREATMENT:   Therapeutic Exercise: Standing UE ergometer - 60sec in each direction for 6 min total (resistance level 40) Low row in standing - 2 x 20 15# Standing Scapular retraction rows at [facility] - 2 x 20 20# Multifidus crunch in sitting - x 20 Standing shoulder ER with flexion RTB - 2 x15  Seated Chest press at [facility] - 2 x 20 10#     Patient demonstrates increased fatigue at end of therapy       PT Education - 09/21/17 0859    Education provided Yes   Education Details HEP: Multifidus crunch   Person(s) Educated Patient   Methods Explanation;Demonstration   Comprehension Verbalized understanding;Returned demonstration             PT Long Term Goals - 09/21/17 0838      PT LONG TERM GOAL #1   Title Patient will be independent with HEP performance to continue benefit of therapy after discharge   Baseline Dependent with exercise performance and technique;  09/21/17: Moderate cueing for exercises   Period Weeks   Status On-going     PT LONG TERM GOAL #2   Title Patient will improve NDI to 0% to improve self- perceived neck dysfunction and decrease neck pain with activity.   Baseline NDI: 6%; 09/21/17: 10%   Time 6   Period Weeks   Status On-going     PT LONG TERM GOAL #3   Title Patient will have a worst pain of 0/10 in his neck and hands to improve resting comfort at home most notably at the end of the day.   Baseline 8/10 in his hands; 09/21/17: 2/10 into the hand   Time 6   Period Weeks   Status On-going               Plan - 09/21/17 0919    Clinical Impression Statement Patient is making progress towards long term goals demonstrating improvement in pain with a worst paiin in the past week of a 2/10 in the back/shoulders indicating functional improvement in the shoulders most notably with scapular retraction function. Patient will benefit from further skilled therapy to address weakness and decreased scapular stabilization to return to prior level of function.    PT Treatment/Interventions Gait training;Electrical Stimulation;Iontophoresis 4mg /ml Dexamethasone;ADLs/Self Care Home Management;Ultrasound;Moist Heat;Balance training;Therapeutic exercise;Fluidtherapy;Therapeutic activities;Functional mobility training;Stair training;Neuromuscular re-education;Patient/family education;Passive range  of motion;Manual techniques;Dry needling   PT Next Visit Plan Progress strengthening and AROM      Patient will benefit from skilled therapeutic intervention in order to improve the following deficits and impairments:  Pain, Decreased mobility, Decreased coordination, Increased muscle spasms, Decreased endurance, Decreased range of motion, Decreased strength, Decreased activity tolerance, Decreased balance, Hypomobility  Visit Diagnosis: Cervicalgia - Plan: PT plan of care cert/re-cert  Pain in left hand - Plan: PT plan of care  cert/re-cert  Pain in right hand - Plan: PT plan of care cert/re-cert       G-Codes - 09/21/17 0930    Functional Assessment Tool Used (Outpatient Only) NDI, AROM, MMT    Functional Limitation Changing and maintaining body position   Changing and Maintaining Body Position Current Status ([id number]) At least 1 percent but less than 20 percent impaired, limited or restricted   Changing and Maintaining Body Position Goal Status (U0454(G8982) At least 1 percent but less than 20 percent impaired, limited or restricted      Problem List There are no active problems to display for this patient.   Benjamin [name], PT DPT 09/21/2017, 10:14 AM  [place] [facility]TS MEDICINE [address] [place], [place], [address] Phone: [phone]   Fax:  [phone]  Name: Benjamin [name] MRN: [id number] Date of Birth: 10-04-1946

## 2017-09-22 ENCOUNTER — Ambulatory Visit: Payer: Medicare HMO

## 2017-09-22 DIAGNOSIS — M79641 Pain in right hand: Secondary | ICD-10-CM

## 2017-09-22 DIAGNOSIS — M542 Cervicalgia: Secondary | ICD-10-CM

## 2017-09-22 DIAGNOSIS — M79642 Pain in left hand: Secondary | ICD-10-CM

## 2017-09-22 NOTE — Therapy (Signed)
[facility] [address] , [place], [address] Phone: [phone]   Fax:  [phone]  Physical Therapy Treatment  Patient Details  Name: Benjamin [name] MRN: [id number] Date of Birth: Oct 21, 1946 Referring Provider: Dr. [place] BlasMoodey  Encounter Date: 09/22/2017      PT End of Session - 09/22/17 1350    Visit Number 12   Number of Visits 13   Date for PT Re-Evaluation 10/19/17   PT Start Time 0945   PT Stop Time 1030   PT Time Calculation (min) 45 min      History reviewed. No pertinent past medical history.  History reviewed. No pertinent surgical history.  There were no vitals filed for this visit.      Subjective Assessment - 09/22/17 1348    Subjective Pt reports mild "strain" in L scapular region without significant pain and unsure how it happened.    Pertinent History PMH: x3 mitral valve replacement, history of anemia,      Pt enters/exits pool via steps Participates in the following  Ambulation with arm swing, good posture - 4 L fwd - 4 L side  Core stabilization in wall sit with UEs, 25x each - Green dumbbell triceps press down - 1 green dumbbell, B hands shoulder ext - flex/ext - abd/add - horiz abd/add  Scapular stabilization 25x ea at wall - F/B small arm circles - Single side bkwd sh rolls, focus back and down - Single side figure 8s - 2 L bench scoot  Planks (modified) 3 x 30 sec each - fwd - B sides   Stretching 3 x 15 sec each - B pecs                            PT Education - 09/22/17 1349    Education provided Yes   Education Details Continued education on overhead lift; pt notes attempting at home, but unsure of correct performance. Pt requiring occasional verbal/tactile cues, but improving technique             PT Long Term Goals - 09/22/17 1353      PT LONG TERM GOAL #1   Title Patient will be independent with HEP performance to continue  benefit of therapy after discharge   Baseline Dependent with exercise performance and technique; 09/21/17: Moderate cueing for exercises   Period Weeks   Status On-going     PT LONG TERM GOAL #2   Title Patient will improve NDI to 0% to improve self- perceived neck dysfunction and decrease neck pain with activity.   Baseline NDI: 6%; 09/21/17: 10%   Time 6   Period Weeks   Status On-going     PT LONG TERM GOAL #3   Title Patient will have a worst pain of 0/10 in his neck and hands to improve resting comfort at home most notably at the end of the day.   Baseline 8/10 in his hands; 09/21/17: 2/10 into the hand   Time 6   Period Weeks   Status On-going               Plan - 09/22/17 1351    Clinical Impression Statement Pt demonstrates improved control with overhead ball lift and keeping scapulae retracted and depressed requiring occasional verbal/tactile cues. Cues also provided with bench scoot and planks, but overall all techniques improving. Able to hold planks 25-30 seconds this session.   PT Treatment/Interventions  Gait training;Electrical Stimulation;Iontophoresis 4mg /ml Dexamethasone;ADLs/Self Care Home Management;Ultrasound;Moist Heat;Balance training;Therapeutic exercise;Fluidtherapy;Therapeutic activities;Functional mobility training;Stair training;Neuromuscular re-education;Patient/family education;Passive range of motion;Manual techniques;Dry needling   PT Next Visit Plan Progress strengthening and AROM      Patient will benefit from skilled therapeutic intervention in order to improve the following deficits and impairments:  Pain, Decreased mobility, Decreased coordination, Increased muscle spasms, Decreased endurance, Decreased range of motion, Decreased strength, Decreased activity tolerance, Decreased balance, Hypomobility  Visit Diagnosis: Cervicalgia  Pain in left hand  Pain in right hand       G-Codes - 2017/10/15 0930    Functional Assessment Tool Used  (Outpatient Only) NDI, AROM, MMT    Functional Limitation Changing and maintaining body position   Changing and Maintaining Body Position Current Status (W0981) At least 1 percent but less than 20 percent impaired, limited or restricted   Changing and Maintaining Body Position Goal Status (X9147) At least 1 percent but less than 20 percent impaired, limited or restricted      Problem List There are no active problems to display for this patient.   Scot Dock 09/22/2017, 1:54 PM  [place] [facility] MAIN [facility] SERVICES [address] [place], [place], [address] Phone: [phone]   Fax:  [phone]  Name: Benjamin [name] MRN: [id number] Date of Birth: 01-Apr-1946

## 2017-09-24 ENCOUNTER — Ambulatory Visit: Payer: Medicare HMO

## 2017-09-28 ENCOUNTER — Ambulatory Visit: Payer: Medicare HMO

## 2017-09-28 DIAGNOSIS — M542 Cervicalgia: Secondary | ICD-10-CM

## 2017-09-28 DIAGNOSIS — M79641 Pain in right hand: Secondary | ICD-10-CM

## 2017-09-28 DIAGNOSIS — M79642 Pain in left hand: Secondary | ICD-10-CM

## 2017-09-28 NOTE — Therapy (Signed)
[place] [facility] PH[facility]TS MEDICINE [address] [place], [address] Phone: [phone]   Fax:  [phone]  Physical Therapy Treatment  Patient Details  Name: Benjamin [name] MRN: [id number] Date of Birth: Jun 04, 1946 Referring Provider: Dr. [place] Blas  Encounter Date: 09/28/2017      PT End of Session - 09/28/17 1320    Visit Number 13   Number of Visits 21   Date for PT Re-Evaluation 10/19/17   PT Start Time 1305   PT Stop Time 1345   PT Time Calculation (min) 40 min   Activity Tolerance Patient tolerated treatment well   Behavior During Therapy [facility] for tasks assessed/performed      History reviewed. No pertinent past medical history.  History reviewed. No pertinent surgical history.  There were no vitals filed for this visit.      Subjective Assessment - 09/28/17 1315    Subjective Patient reports increased soreness along B of his upper traps. Patient states he performed more exercises over the weekend leading to the soreness.    Pertinent History PMH: x3 mitral valve replacement, history of anemia,    Currently in Pain? Yes   Pain Score 3    Pain Location Shoulder   Pain Orientation Right;Left   Pain Descriptors / Indicators Aching   Pain Type Acute pain   Pain Onset More than a month ago      TREATMENT:   Therapeutic Exercise: Standing shoulder Extension at OMEGA - 2 x 20 TRX rows in standing - 2 x 20  TRX push up in standing - 2 x 20  Behind the back push downs and retraction - 2 x 20 Standing shoulders abducted to 90degrees - x 30 scapular retraction Prayer stretch - 2 x 20  Standing straight arm push downs with unilateral UE - 2 x 20 5# Standing retraction at OMEGA - 2 x 20 15#    Patient demonstrates increased fatigue at end of therapy       PT Education - 09/28/17 1320    Education provided Yes   Education Details Reinforced HEP   Person(s) Educated Patient   Methods Explanation;Demonstration   Comprehension Verbalized understanding;Returned demonstration             PT Long Term Goals - 09/22/17 1353      PT LONG TERM GOAL #1   Title Patient will be independent with HEP performance to continue benefit of therapy after discharge   Baseline Dependent with exercise performance and technique; 09/21/17: Moderate cueing for exercises   Period Weeks   Status On-going     PT LONG TERM GOAL #2   Title Patient will improve NDI to 0% to improve self- perceived neck dysfunction and decrease neck pain with activity.   Baseline NDI: 6%; 09/21/17: 10%   Time 6   Period Weeks   Status On-going     PT LONG TERM GOAL #3   Title Patient will have a worst pain of 0/10 in his neck and hands to improve resting comfort at home most notably at the end of the day.   Baseline 8/10 in his hands; 09/21/17: 2/10 into the hand   Time 6   Period Weeks   Status On-going               Plan - 09/28/17 1338    Clinical Impression Statement Patient demonstrates improvement with postural control with technique with exercises today. Patient reports decrease in pain after exercises  performance indicating improvement in muscular coordination. Patient demonstrates decreased muscular endurance as indicated by fatigue at end of session. Patient will benefit from further skilled therapy to return to prior level of function.    PT Treatment/Interventions Gait training;Electrical Stimulation;Iontophoresis 4mg /ml Dexamethasone;ADLs/Self Care Home Management;Ultrasound;Moist Heat;Balance training;Therapeutic exercise;Fluidtherapy;Therapeutic activities;Functional mobility training;Stair training;Neuromuscular re-education;Patient/family education;Passive range of motion;Manual techniques;Dry needling   PT Next Visit Plan Progress strengthening and AROM      Patient will benefit from skilled therapeutic intervention in order to improve the following deficits and impairments:  Pain, Decreased mobility,  Decreased coordination, Increased muscle spasms, Decreased endurance, Decreased range of motion, Decreased strength, Decreased activity tolerance, Decreased balance, Hypomobility  Visit Diagnosis: Pain in right hand  Cervicalgia  Pain in left hand     Problem List There are no active problems to display for this patient.   Myrene GalasWesley Kaj Vasil, PT DPT 09/28/2017, 1:56 PM  [place] [facility] PHYSICAL AND SPORTS MEDICINE [address] [place], [place], [address] Phone: [phone]   Fax:  [phone]  Name: Benjamin [name] MRN: [id number] Date of Birth: 09/26/1946

## 2017-09-29 ENCOUNTER — Ambulatory Visit: Payer: Medicare HMO

## 2017-09-29 DIAGNOSIS — M79641 Pain in right hand: Secondary | ICD-10-CM

## 2017-09-29 DIAGNOSIS — M542 Cervicalgia: Secondary | ICD-10-CM

## 2017-09-29 DIAGNOSIS — M79642 Pain in left hand: Secondary | ICD-10-CM

## 2017-09-29 NOTE — Therapy (Signed)
[place] [facility] MAIN [facility] SERVICES [address] [place], [place], [address] Phone: [phone]   Fax:  [phone]  Physical Therapy Treatment  Patient Details  Name: Benjamin [name] MRN: [id number] Date of Birth: December 15, 1945 Referring Provider: Dr. [place] Blas  Encounter Date: 09/29/2017      PT End of Session - 09/29/17 1306    Visit Number 14   Number of Visits 21   Date for PT Re-Evaluation 10/19/17   PT Start Time 0800   PT Stop Time 0845   PT Time Calculation (min) 45 min   Activity Tolerance Patient tolerated treatment well   Behavior During Therapy [facility] for tasks assessed/performed      History reviewed. No pertinent past medical history.  History reviewed. No pertinent surgical history.  There were no vitals filed for this visit.      Subjective Assessment - 09/29/17 1302    Subjective Pt has no complaints of pain today. Pt reports he has been compliant with stretching, positioning awareness with shoulders/neck with sitting and driving, and exercises especially backward shoulder rolls.    Pertinent History PMH: x3 mitral valve replacement, history of anemia,      Ambulation, with arms, good posture - 4 L fwd - 4 L side  Sh/scap stabilization with abdominal stab in wall sit, 20x ea - Triceps press down, green dumbbells - Sh flex/ext 2 hands on 1 green dumbbell, slow return - Sh abd/add, green dumbells, slow return - Sh flex/ext, speed for resist - Sh abd/add, speed for resist - Sh horiz abd/add, speed for resist - Small arm circles fwd/bkwd, arms 90 degree abducted - Single bkwd sh rolls, focus back and down - Single arm figure 8's  Bench scoot with scapular retraction and depression, 2 L  Modified planks, on forearm, 3 x 30 sec ea - Fwd - Side R/L  Stretching, hot tub, 3 x 15 seconds each - Up trap/scalenes, 3 positions - Pec, standing single side at a time                            PT  Education - 09/29/17 1305    Education provided Yes   Education Details Continued education/cueing for backward shoulder rolls as well as scapular retraction/depression with overhead lift.    Person(s) Educated Patient   Methods Explanation;Demonstration;Verbal cues;Tactile cues   Comprehension Verbalized understanding;Returned demonstration;Verbal cues required;Tactile cues required             PT Long Term Goals - 09/29/17 1309      PT LONG TERM GOAL #1   Title Patient will be independent with HEP performance to continue benefit of therapy after discharge   Baseline Dependent with exercise performance and technique; 09/21/17: Moderate cueing for exercises   Period Weeks   Status On-going     PT LONG TERM GOAL #2   Title Patient will improve NDI to 0% to improve self- perceived neck dysfunction and decrease neck pain with activity.   Baseline NDI: 6%; 09/21/17: 10%   Time 6   Period Weeks   Status On-going     PT LONG TERM GOAL #3   Title Patient will have a worst pain of 0/10 in his neck and hands to improve resting comfort at home most notably at the end of the day.   Baseline 8/10 in his hands; 09/21/17: 2/10 into the hand   Time 6   Period Weeks  Status On-going               Plan - 09/29/17 1307    Clinical Impression Statement Much improved bench scoot for scapular depression; demonstrates good scapular retraction during exercise, as well. Continues to require cues for bkwd shoulder roll and scapular stabilization with overhead lift. Plank work causes temporary numbness in hands with pressure. Changed planks to forearm, which alleviated issue.    PT Treatment/Interventions Gait training;Electrical Stimulation;Iontophoresis 4mg /ml Dexamethasone;ADLs/Self Care Home Management;Ultrasound;Moist Heat;Balance training;Therapeutic exercise;Fluidtherapy;Therapeutic activities;Functional mobility training;Stair training;Neuromuscular re-education;Patient/family  education;Passive range of motion;Manual techniques;Dry needling   PT Next Visit Plan Progress strengthening and AROM      Patient will benefit from skilled therapeutic intervention in order to improve the following deficits and impairments:  Pain, Decreased mobility, Decreased coordination, Increased muscle spasms, Decreased endurance, Decreased range of motion, Decreased strength, Decreased activity tolerance, Decreased balance, Hypomobility  Visit Diagnosis: Pain in right hand  Cervicalgia  Pain in left hand     Problem List There are no active problems to display for this patient.   Scot DockHeidi E Barnes 09/29/2017, 1:11 PM  [place] [facility] [address] [place], [place], [address] Phone: [phone]   Fax:  [phone]  Name: Benjamin [name] MRN: [id number] Date of Birth: 01/02/1946

## 2017-10-01 ENCOUNTER — Ambulatory Visit: Payer: Medicare HMO

## 2017-10-05 ENCOUNTER — Ambulatory Visit: Payer: Medicare HMO | Attending: Internal Medicine

## 2017-10-05 DIAGNOSIS — M542 Cervicalgia: Secondary | ICD-10-CM | POA: Insufficient documentation

## 2017-10-05 DIAGNOSIS — M79642 Pain in left hand: Secondary | ICD-10-CM | POA: Diagnosis present

## 2017-10-05 DIAGNOSIS — M79641 Pain in right hand: Secondary | ICD-10-CM | POA: Insufficient documentation

## 2017-10-05 NOTE — Therapy (Signed)
[place] [facility] PHYSICAL AND SPORTS MEDICINE [address] [place], [place], [address] Phone: [phone]   Fax:  [phone]  Physical Therapy Treatment  Patient Details  Name: Benjamin [name] MRN: [id number] Date of Birth: August 12, 1946 Referring Provider: Dr. [place] BlasMoodey   Encounter Date: 10/05/2017  PT End of Session - 10/05/17 1006    Visit Number  15    Number of Visits  21    Date for PT Re-Evaluation  10/19/17    PT Start Time  0945    PT Stop Time  1030    PT Time Calculation (min)  45 min    Activity Tolerance  Patient tolerated treatment well    Behavior During Therapy  [facility] for tasks assessed/performed       History reviewed. No pertinent past medical history.  History reviewed. No pertinent surgical history.  There were no vitals filed for this visit.  Subjective Assessment - 10/05/17 0953    Subjective  Patient reports no increased patient today. Patient reports he's been moderating his exercises to not increase soreness with performance.     Pertinent History  PMH: x3 mitral valve replacement, history of anemia,     How long can you sit comfortably?  Driving: increase in pain after driving 50 miles    Diagnostic tests  X-Ray: shortening of spaces    Currently in Pain?  No/denies       TREATMENT:   Therapeutic Exercise: Standing retraction at OMEGA - 2 x 20 20# Standing thoracic Extension at OMEGA - x20 5# SNAGS in standing with use of towel - x 20 ext/Side bending and rotation Standing straight arm push downs with unilateral UE - 2 x 20 5# Standing Straight arm push forwards on TRX straps - 2 x 20  Standing back against wall - wall angels - 2 x 12  Standing serratus push up PLUS at raised table - 2 x 10  Ball circles in standing against wall with yellow physioball - 2 x 10      Patient demonstrates increased fatigue at end of therapy    PT Education - 10/05/17 1006    Education provided  Yes    Education Details   form/technique with exercise    Person(s) Educated  Patient    Methods  Explanation;Demonstration    Comprehension  Verbalized understanding;Returned demonstration          PT Long Term Goals - 09/29/17 1309      PT LONG TERM GOAL #1   Title  Patient will be independent with HEP performance to continue benefit of therapy after discharge    Baseline  Dependent with exercise performance and technique; 09/21/17: Moderate cueing for exercises    Period  Weeks    Status  On-going      PT LONG TERM GOAL #2   Title  Patient will improve NDI to 0% to improve self- perceived neck dysfunction and decrease neck pain with activity.    Baseline  NDI: 6%; 09/21/17: 10%    Time  6    Period  Weeks    Status  On-going      PT LONG TERM GOAL #3   Title  Patient will have a worst pain of 0/10 in his neck and hands to improve resting comfort at home most notably at the end of the day.    Baseline  8/10 in his hands; 09/21/17: 2/10 into the hand    Time  6  Period  Weeks    Status  On-going            Plan - 10/05/17 1008    Clinical Impression Statement  Patient demonstrates decreased pain and demonstrates improvement in muscular coordination with exercises indicating functional improvement and carryover between visitation sessions. Although patient is improving, he continues to demonstrate decreased cervical AROM and decreased muscular endurance and will benefit from further skilled therapy to return to prior level of function.     PT Treatment/Interventions  Gait training;Electrical Stimulation;Iontophoresis 4mg /ml Dexamethasone;ADLs/Self Care Home Management;Ultrasound;Moist Heat;Balance training;Therapeutic exercise;Fluidtherapy;Therapeutic activities;Functional mobility training;Stair training;Neuromuscular re-education;Patient/family education;Passive range of motion;Manual techniques;Dry needling    PT Next Visit Plan  Progress strengthening and AROM       Patient will benefit  from skilled therapeutic intervention in order to improve the following deficits and impairments:  Pain, Decreased mobility, Decreased coordination, Increased muscle spasms, Decreased endurance, Decreased range of motion, Decreased strength, Decreased activity tolerance, Decreased balance, Hypomobility  Visit Diagnosis: Pain in right hand  Cervicalgia  Pain in left hand     Problem List There are no active problems to display for this patient.   Benjamin [name], PT DPT 10/05/2017, 10:37 AM  [place] [facility] PHYSICAL AND SPORTS MEDICINE [address], [place], [address] Phone: [phone]   Fax:  [phone]  Name: Benjamin [name] MRN: [id number] Date of Birth: 1946-12-01

## 2017-10-06 ENCOUNTER — Ambulatory Visit: Payer: Medicare HMO | Admitting: Physical Therapy

## 2017-10-06 ENCOUNTER — Ambulatory Visit: Payer: Medicare HMO

## 2017-10-06 DIAGNOSIS — M79641 Pain in right hand: Secondary | ICD-10-CM

## 2017-10-06 DIAGNOSIS — M79642 Pain in left hand: Secondary | ICD-10-CM

## 2017-10-06 DIAGNOSIS — M542 Cervicalgia: Secondary | ICD-10-CM

## 2017-10-06 NOTE — Therapy (Signed)
[facility] [address] [place], [place], [address] Phone: [phone]   Fax:  [phone]  Physical Therapy Treatment  Patient Details  Name: Benjamin [name] MRN: [id number] Date of Birth: 12-Jan-1946 Referring Provider: Dr. [place] BlasMoodey   Encounter Date: 10/06/2017  PT End of Session - 10/06/17 1513    Visit Number  16    Number of Visits  21    Date for PT Re-Evaluation  10/19/17    PT Start Time  1200    PT Stop Time  1245    PT Time Calculation (min)  45 min    Activity Tolerance  Patient tolerated treatment well    Behavior During Therapy  [facility] for tasks assessed/performed       History reviewed. No pertinent past medical history.  History reviewed. No pertinent surgical history.  There were no vitals filed for this visit.  Subjective Assessment - 10/06/17 1512    Subjective  Pt reported increased numbness in fingers after last session.  Stated he had trouble buttoning his shirt in dressing room.  Numbness resolved and overall feeling well today.    Pertinent History  PMH: x3 mitral valve replacement, history of anemia,     Limitations  Lifting    Currently in Pain?  No/denies       Ambulation with arms, good posture 4 L forward 4 L sideways  Sh/scap stabilization with abdominal stabilization in wall sit x 20 Triceps press down, green dumbbells Sh flex/ext 2 hands 1 dumbbell, slow return Sh ab/add green dumbbell, slow return Sh flex/ext speed for resist Sh ab/add speed for resist Sh horizontal ab/add speed for resist Small arm circles forward and back, arms 90 degrees abducted Single bkwds sh rolls, focus back and down Single arm figure 8's  Bench scoot with scapular retraction and depression 2 L  Modified planks on forearm 90 seconds each Fwd Side R and L   Stretching in hot tub 3 x 30 seconds each Trap/scalened 3 positions Pectoral stretch standing with rail R and L                         PT Education - 10/06/17 1513    Education provided  Yes    Education Details  exercise form and technique    Person(s) Educated  Patient    Methods  Explanation;Demonstration    Comprehension  Verbalized understanding;Returned demonstration          PT Long Term Goals - 09/29/17 1309      PT LONG TERM GOAL #1   Title  Patient will be independent with HEP performance to continue benefit of therapy after discharge    Baseline  Dependent with exercise performance and technique; 09/21/17: Moderate cueing for exercises    Period  Weeks    Status  On-going      PT LONG TERM GOAL #2   Title  Patient will improve NDI to 0% to improve self- perceived neck dysfunction and decrease neck pain with activity.    Baseline  NDI: 6%; 09/21/17: 10%    Time  6    Period  Weeks    Status  On-going      PT LONG TERM GOAL #3   Title  Patient will have a worst pain of 0/10 in his neck and hands to improve resting comfort at home most notably at the end of the day.    Baseline  8/10 in his hands; 09/21/17: 2/10 into the hand    Time  6    Period  Weeks    Status  On-going            Plan - 10/06/17 1514    Clinical Impression Statement  No increased numbness this session with exercises.  Reports overall improvement in symptoms and complaints.    Clinical Presentation  Stable    Clinical Decision Making  Moderate    Rehab Potential  Fair    Clinical Impairments Affecting Rehab Potential  (+) highly motivated, (-) chronicity of pain    PT Frequency  2x / week    PT Duration  6 weeks    PT Treatment/Interventions  Gait training;Electrical Stimulation;Iontophoresis 4mg /ml Dexamethasone;ADLs/Self Care Home Management;Ultrasound;Moist Heat;Balance training;Therapeutic exercise;Fluidtherapy;Therapeutic activities;Functional mobility training;Stair training;Neuromuscular re-education;Patient/family education;Passive range of motion;Manual techniques;Dry  needling    PT Next Visit Plan  Progress strengthening and AROM       Patient will benefit from skilled therapeutic intervention in order to improve the following deficits and impairments:  Pain, Decreased mobility, Decreased coordination, Increased muscle spasms, Decreased endurance, Decreased range of motion, Decreased strength, Decreased activity tolerance, Decreased balance, Hypomobility  Visit Diagnosis: Pain in right hand  Cervicalgia  Pain in left hand     Problem List There are no active problems to display for this patient.   Danielle DessSarah Oluwatamilore Starnes, PTA 10/06/17, 3:54 PM   [place] [facility] [address] [place], [place], [address] Phone: [phone]   Fax:  [phone]  Name: Benjamin [name] MRN: [id number] Date of Birth: [birth year]

## 2017-10-08 ENCOUNTER — Ambulatory Visit: Payer: Medicare HMO

## 2017-10-12 ENCOUNTER — Ambulatory Visit: Payer: Medicare HMO

## 2017-10-15 ENCOUNTER — Ambulatory Visit: Payer: Medicare HMO

## 2017-10-15 DIAGNOSIS — M79641 Pain in right hand: Secondary | ICD-10-CM | POA: Diagnosis not present

## 2017-10-15 DIAGNOSIS — M542 Cervicalgia: Secondary | ICD-10-CM

## 2017-10-15 DIAGNOSIS — M79642 Pain in left hand: Secondary | ICD-10-CM

## 2017-10-15 NOTE — Therapy (Signed)
[place] [facility] [address] [place], [place], [address] Phone: [phone]   Fax:  [phone]  Physical Therapy Treatment  Patient Details  Name: Benjamin [name] MRN: [id number] Date of Birth: [birth year] Referring Provider: Dr. [place] BlasMoodey   Encounter Date: 10/15/2017  PT End of Session - 10/15/17 1415    Visit Number  17    Number of Visits  21    Date for PT Re-Evaluation  10/19/17    PT Start Time  1115    PT Stop Time  1200    PT Time Calculation (min)  45 min       History reviewed. No pertinent past medical history.  History reviewed. No pertinent surgical history.  There were no vitals filed for this visit.  Subjective Assessment - 10/15/17 1413    Subjective  Pt reports aggravating hands post raking a lot of leaves, but resolved after a few days. Pt has been very compliant with HEP using a mirror as visual aid for proper form/posture    Pertinent History  PMH: x3 mitral valve replacement, history of anemia,     Limitations  Lifting    How long can you sit comfortably?  Driving: increase in pain after driving 50 miles    Diagnostic tests  X-Ray: shortening of spaces    Pain Onset  More than a month ago      Enters/exits pool/whirlpool via steps Participates in the following  Ambulation, no UE support, good posture, sh's back and down - 4 L fwd - 4 L side  Wall sit core stabilization and scapular stabilization - Green dumbbells, 30x each   - Triceps press down   - 1 dumbbell sh flex/ext   - Sh abd/add - No dumbbell, speed, 30x each   - sh flex/ext   - sh abd/add   - sh horiz abd/add   - single UE figure 8's   - single bkwd sh rolls   - ball lift  Bench scapular retr/depression scoot, 2 L  Planks in whirlpool for decreased water support, 3 x 20 sec each - Fwd - Side   Seated cervical stretching, 3 x 10 sec - up trap - rotation                               PT Long  Term Goals - 10/15/17 1417      PT LONG TERM GOAL #1   Title  Patient will be independent with HEP performance to continue benefit of therapy after discharge    Baseline  Dependent with exercise performance and technique; 09/21/17: Moderate cueing for exercises    Period  Weeks    Status  On-going      PT LONG TERM GOAL #2   Title  Patient will improve NDI to 0% to improve self- perceived neck dysfunction and decrease neck pain with activity.    Baseline  NDI: 6%; 09/21/17: 10%    Time  6    Period  Weeks    Status  On-going      PT LONG TERM GOAL #3   Title  Patient will have a worst pain of 0/10 in his neck and hands to improve resting comfort at home most notably at the end of the day.    Baseline  8/10 in his hands; 09/21/17: 2/10 into the hand    Time  6    Period  Weeks    Status  On-going            Plan - 10/15/17 1415    Clinical Impression Statement  Pt tolerated session well without numbess/tingling in hands even with planks performed on on hands with decreased water support. Improving posture/form throughout exercises with less verbal cues overall, but not 100% consistent at this time.     Rehab Potential  Fair    Clinical Impairments Affecting Rehab Potential  (+) highly motivated, (-) chronicity of pain    PT Frequency  2x / week    PT Duration  6 weeks    PT Treatment/Interventions  Gait training;Electrical Stimulation;Iontophoresis 4mg /ml Dexamethasone;ADLs/Self Care Home Management;Ultrasound;Moist Heat;Balance training;Therapeutic exercise;Fluidtherapy;Therapeutic activities;Functional mobility training;Stair training;Neuromuscular re-education;Patient/family education;Passive range of motion;Manual techniques;Dry needling    PT Next Visit Plan  Progress strengthening and AROM       Patient will benefit from skilled therapeutic intervention in order to improve the following deficits and impairments:  Pain, Decreased mobility, Decreased coordination, Increased  muscle spasms, Decreased endurance, Decreased range of motion, Decreased strength, Decreased activity tolerance, Decreased balance, Hypomobility  Visit Diagnosis: Pain in right hand  Cervicalgia  Pain in left hand     Problem List There are no active problems to display for this patient.   Scot DockHeidi E Barnes 10/15/2017, 2:19 PM  [facility] [address] [place], [place], [address] Phone: [phone]   Fax:  [phone]  Name: Benjamin [name] MRN: [id number] Date of Birth: 12/31/1945

## 2017-10-19 ENCOUNTER — Ambulatory Visit: Payer: Medicare HMO

## 2017-10-19 DIAGNOSIS — M79641 Pain in right hand: Secondary | ICD-10-CM

## 2017-10-19 DIAGNOSIS — M542 Cervicalgia: Secondary | ICD-10-CM

## 2017-10-19 DIAGNOSIS — M79642 Pain in left hand: Secondary | ICD-10-CM

## 2017-10-19 NOTE — Therapy (Signed)
[place] [facility] [address] [place], [place], [address] Phone: [phone]   Fax:  [phone]  Physical Therapy Treatment  Patient Details  Name: Benjamin [name] MRN: [id number] Date of Birth: [birth year] Referring Provider: Dr. [place] BlasMoodey   Encounter Date: 10/19/2017  PT End of Session - 10/19/17 0924    Visit Number  18    Number of Visits  21    Date for PT Re-Evaluation  10/19/17    PT Start Time  0901    PT Stop Time  0945    PT Time Calculation (min)  44 min    Activity Tolerance  Patient tolerated treatment well    Behavior During Therapy  [facility] for tasks assessed/performed       History reviewed. No pertinent past medical history.  History reviewed. No pertinent surgical history.  There were no vitals filed for this visit.  Subjective Assessment - 10/19/17 0907    Subjective  Patient reports he continues to have increased numbness and tingling when waking up in the morning and when he's shaving. Patient reports tingling along digits 1-3.    Pertinent History  PMH: x3 mitral valve replacement, history of anemia,     Limitations  Lifting    How long can you sit comfortably?  Driving: increase in pain after driving 50 miles    Diagnostic tests  X-Ray: shortening of spaces    Currently in Pain?  No/denies    Pain Onset  More than a month ago         TREATMENT:   Therapeutic Exercise: Standing retraction at OMEGA - 2 x 20 20# Standing straight arm push downs - 2 x 20 20# Scapular Retraction in supine - 2 x 20  Median nerve flossing in standing - 2 x 20  Standing planks Weight shifts throughout UEs - 2 x 20  TRX rows in standing - 2 x 20  Standing back against wall - wall angels - 2 x 12  Standing Straight arm push forwards on TRX straps - 2 x 20   Manual Therapy Supine median nerve mobilizations with PT assist; light symptoms noted at end performed for 8 min with no increase in pain     Patient  demonstrates increased fatigue at end of therapy     PT Education - 10/19/17 0916    Education provided  Yes    Education Details  HEP: Median nerve floss    Person(s) Educated  Patient    Methods  Explanation;Demonstration    Comprehension  Verbalized understanding;Returned demonstration          PT Long Term Goals - 10/15/17 1417      PT LONG TERM GOAL #1   Title  Patient will be independent with HEP performance to continue benefit of therapy after discharge    Baseline  Dependent with exercise performance and technique; 09/21/17: Moderate cueing for exercises    Period  Weeks    Status  On-going      PT LONG TERM GOAL #2   Title  Patient will improve NDI to 0% to improve self- perceived neck dysfunction and decrease neck pain with activity.    Baseline  NDI: 6%; 09/21/17: 10%    Time  6    Period  Weeks    Status  On-going      PT LONG TERM GOAL #3   Title  Patient will have a worst pain of 0/10 in his neck  and hands to improve resting comfort at home most notably at the end of the day.    Baseline  8/10 in his hands; 09/21/17: 2/10 into the hand    Time  6    Period  Weeks    Status  On-going            Plan - 10/19/17 0931    Clinical Impression Statement  Patient demonstrates increased numbness and tingling when performing median nerve stressing exercises indicating possible median nerve involvement. Reinforced today's session with having patient perform median nerve flossing in standing as part of HEP. Patient reports resolution of symptoms at end of session and will beneft from further skilled therapy to return to prior level of function.      Rehab Potential  Fair    Clinical Impairments Affecting Rehab Potential  (+) highly motivated, (-) chronicity of pain    PT Frequency  2x / week    PT Duration  6 weeks    PT Treatment/Interventions  Gait training;Electrical Stimulation;Iontophoresis 4mg /ml Dexamethasone;ADLs/Self Care Home Management;Ultrasound;Moist  Heat;Balance training;Therapeutic exercise;Fluidtherapy;Therapeutic activities;Functional mobility training;Stair training;Neuromuscular re-education;Patient/family education;Passive range of motion;Manual techniques;Dry needling    PT Next Visit Plan  Progress strengthening and AROM       Patient will benefit from skilled therapeutic intervention in order to improve the following deficits and impairments:  Pain, Decreased mobility, Decreased coordination, Increased muscle spasms, Decreased endurance, Decreased range of motion, Decreased strength, Decreased activity tolerance, Decreased balance, Hypomobility  Visit Diagnosis: Pain in right hand  Cervicalgia  Pain in left hand     Problem List There are no active problems to display for this patient.   Myrene GalasWesley Aryonna Gunnerson, PT DPT 10/19/2017, 9:43 AM  [place] [facility] [address] [place], [place], [address] Phone: [phone]   Fax:  [phone]  Name: Benjamin [name] MRN: [id number] Date of Birth: [birth year]

## 2017-10-26 ENCOUNTER — Ambulatory Visit: Payer: Medicare HMO

## 2017-10-29 ENCOUNTER — Ambulatory Visit: Payer: Medicare HMO

## 2017-10-29 DIAGNOSIS — M542 Cervicalgia: Secondary | ICD-10-CM

## 2017-10-29 DIAGNOSIS — M79642 Pain in left hand: Secondary | ICD-10-CM

## 2017-10-29 DIAGNOSIS — M79641 Pain in right hand: Secondary | ICD-10-CM | POA: Diagnosis not present

## 2017-10-29 NOTE — Therapy (Signed)
[facility] [address] [place], [place], [address] Phone: [phone]   Fax:  [phone]  Physical Therapy Treatment  Patient Details  Name: Benjamin BernDavid L Choplin MRN: [id number] Date of Birth: May 31, 1946 Referring Provider: Dr. [place] BlasMoodey   Encounter Date: 10/29/2017  PT End of Session - 10/29/17 1418    Visit Number  19    Number of Visits  21    Date for PT Re-Evaluation  10/19/17    PT Start Time  1120    PT Stop Time  1205    PT Time Calculation (min)  45 min       History reviewed. No pertinent past medical history.  History reviewed. No pertinent surgical history.  There were no vitals filed for this visit.  Subjective Assessment - 10/29/17 1414    Subjective  Pt reports having several episodes of a "strong electrical type shock through RUE that cause severe temporary pain and weakness" when grasping utensil. Pt notes no residual problem, but concerned with new symptom. Will contact primary therapist and discuss possibility of further tests (ie. nerve conduction)    Pertinent History  PMH: x3 mitral valve replacement, history of anemia,     Limitations  Lifting    How long can you sit comfortably?  Driving: increase in pain after driving 50 miles    Diagnostic tests  X-Ray: shortening of spaces    Pain Onset  More than a month ago      Pt enters/exits via steps Participates in the following  Ambulation, no UE support - 4 L fwd - 4 L side  Core and scapular stabilization exercises at wall, 25x ea - sh flex/ext - sh abd/add - sh horiz abd/add - fwd/bkwd arm circles - figure 8's - bkwd sh rolls Green dumbells - triceps press down - sh flex/ext, 1 dumbbell - sh abd/add  Planks, 3 x 30 sec each - fwd - side  Stretching - upper trap - scalenes - cervical rotation                         PT Education - 10/29/17 1418    Education provided  Yes    Education Details  Re  education/continued ed on proper scapular positioning/stabilization with exercises. Particularly with planks and ball lift this date.           PT Long Term Goals - 10/29/17 1421      PT LONG TERM GOAL #1   Title  Patient will be independent with HEP performance to continue benefit of therapy after discharge    Baseline  Dependent with exercise performance and technique; 09/21/17: Moderate cueing for exercises    Period  Weeks    Status  On-going      PT LONG TERM GOAL #2   Title  Patient will improve NDI to 0% to improve self- perceived neck dysfunction and decrease neck pain with activity.    Baseline  NDI: 6%; 09/21/17: 10%    Time  6    Period  Weeks    Status  On-going      PT LONG TERM GOAL #3   Title  Patient will have a worst pain of 0/10 in his neck and hands to improve resting comfort at home most notably at the end of the day.    Baseline  8/10 in his hands; 09/21/17: 2/10 into the hand    Time  6  Period  Weeks    Status  On-going            Plan - 10/29/17 1419    Clinical Impression Statement  Pt needs to be seen for re eval with primary therapist. Plan to continue aquatic treatment     Rehab Potential  Fair    Clinical Impairments Affecting Rehab Potential  (+) highly motivated, (-) chronicity of pain    PT Frequency  2x / week    PT Duration  6 weeks    PT Treatment/Interventions  Gait training;Electrical Stimulation;Iontophoresis 4mg /ml Dexamethasone;ADLs/Self Care Home Management;Ultrasound;Moist Heat;Balance training;Therapeutic exercise;Fluidtherapy;Therapeutic activities;Functional mobility training;Stair training;Neuromuscular re-education;Patient/family education;Passive range of motion;Manual techniques;Dry needling    PT Next Visit Plan  Progress strengthening and AROM       Patient will benefit from skilled therapeutic intervention in order to improve the following deficits and impairments:  Pain, Decreased mobility, Decreased coordination,  Increased muscle spasms, Decreased endurance, Decreased range of motion, Decreased strength, Decreased activity tolerance, Decreased balance, Hypomobility  Visit Diagnosis: Pain in right hand  Cervicalgia  Pain in left hand     Problem List There are no active problems to display for this patient.   Scot DockHeidi E Barnes 10/29/2017, 2:23 PM  [place] [facility] [address] [place], [place], [address] Phone: [phone]   Fax:  [phone]  Name: Benjamin BernDavid L Manni MRN: [id number] Date of Birth: [birth year]

## 2017-11-02 ENCOUNTER — Ambulatory Visit: Payer: Medicare HMO | Attending: Internal Medicine

## 2017-11-02 DIAGNOSIS — M542 Cervicalgia: Secondary | ICD-10-CM | POA: Diagnosis present

## 2017-11-02 DIAGNOSIS — M79641 Pain in right hand: Secondary | ICD-10-CM | POA: Insufficient documentation

## 2017-11-02 DIAGNOSIS — M79642 Pain in left hand: Secondary | ICD-10-CM | POA: Diagnosis present

## 2017-11-02 NOTE — Therapy (Signed)
[place] [facility] [address] [place], [place], [address] Phone: [phone]   Fax:  [phone]  Physical Therapy Treatment  Patient Details  Name: Benjamin BernDavid L Mode MRN: [id number] Date of Birth: [birth year] Referring Provider: Dr. [place] BlasMoodey   Encounter Date: 11/02/2017  PT End of Session - 11/02/17 1209    Visit Number  20    Number of Visits  29    Date for PT Re-Evaluation  11/30/17    Authorization Type  10/10 G Code    PT Start Time  1045    PT Stop Time  1120    PT Time Calculation (min)  35 min    Activity Tolerance  Patient tolerated treatment well    Behavior During Therapy  [facility] for tasks assessed/performed       History reviewed. No pertinent past medical history.  History reviewed. No pertinent surgical history.  There were no vitals filed for this visit.  Subjective Assessment - 11/02/17 1053    Subjective  Patient reports increased pain when driving, at the end of the day, and after exercising. Patient reports increased pain and a shocking sensation when trying to perform fine motor tasks. Patient reports he has greatly improved since the start of therapy but continues to have pain when driving for 30min in the car.     Pertinent History  PMH: x3 mitral valve replacement, history of anemia,     Limitations  Lifting    How long can you sit comfortably?  Driving: increase in pain after driving 50 miles    Diagnostic tests  X-Ray: shortening of spaces    Currently in Pain?  No/denies    Pain Onset  More than a month ago         TREATMENT:   Therapeutic Exercise: Supine retraction "I" formation - x 20  Supine retraction "T" formation - x 20 Ulnar nerve flossing - x 20    Manual Therapy Seated ulnar nerve mobilizations with PT assist; light symptoms noted at end performed with no increase in pain Thoracic mobs: grade III x30sec each segment C1-T8 centrally to improve mobilization and movement   Patient  demonstrates no increase in pain at end of therapy    PT Education - 11/02/17 1207    Education provided  Yes    Education Details  Educated on proper positioning with sitting for long periods of time and driving    Person(s) Educated  Patient    Methods  Explanation;Demonstration    Comprehension  Returned demonstration;Verbalized understanding          PT Long Term Goals - 11/02/17 1214      PT LONG TERM GOAL #1   Title  Patient will be independent with HEP performance to continue benefit of therapy after discharge    Baseline  Dependent with exercise performance and technique; 09/21/17: Moderate cueing for exercises; 11/02/17: moderate cueing for positioning    Period  Weeks    Status  On-going      PT LONG TERM GOAL #2   Title  Patient will improve NDI to 0% to improve self- perceived neck dysfunction and decrease neck pain with activity.    Baseline  NDI: 6%; 09/21/17: 10%; 11/02/17: 28%    Time  6    Period  Weeks    Status  On-going      PT LONG TERM GOAL #3   Title  Patient will have a worst pain of 0/10  in his neck and hands to improve resting comfort at home most notably at the end of the day.    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     Baseline  increased pain after 30min of driving    Time  6    Period  Weeks    Status  New            Plan - 11/02/17 1210    Clinical Impression Statement  Patient demosntrates progression with physical therapy and improvement towards long term goals. Patient demonstrates increased NDI indicating worsening of symtpoms however, subjectively patient is able to perform a greater amount of functional tasks such as driving for longer periods of time and has  less pain at the end of the day compared to the previous treatment session. Although patient is improving, he continues to have pain after sitting/driving for long periods of time indicating poor head on neck positioning and decreased motor control. Patient will benefit from further skilled therapy focused on improving these current limitations to return to prior level of function.     Rehab Potential  Fair    Clinical Impairments Affecting Rehab Potential  (+) highly motivated, (-) chronicity of pain    PT Frequency  2x / week    PT Duration  6 weeks    PT Treatment/Interventions  Gait training;Electrical Stimulation;Iontophoresis 4mg /ml Dexamethasone;ADLs/Self Care Home Management;Ultrasound;Moist Heat;Balance training;Therapeutic exercise;Fluidtherapy;Therapeutic activities;Functional mobility training;Stair training;Neuromuscular re-education;Patient/family education;Passive range of motion;Manual techniques;Dry needling    PT Next Visit Plan  Progress strengthening and AROM       Patient will benefit from skilled therapeutic intervention in order to improve the following deficits and impairments:  Pain, Decreased mobility, Decreased coordination, Increased muscle spasms, Decreased endurance, Decreased range of motion, Decreased strength, Decreased activity tolerance, Decreased balance, Hypomobility  Visit Diagnosis: Cervicalgia - Plan: PT plan of care cert/re-cert  Pain in right hand - Plan: PT plan of care cert/re-cert  Pain in left hand - Plan: PT plan of care cert/re-cert   G-Codes - 11/02/17 1208    Functional Assessment Tool Used (Outpatient Only)  NDI, AROM, MMT     Functional Limitation  Changing and maintaining body position    Changing and Maintaining Body Position Current Status ([id number])  At least 1 percent but less than 20 percent impaired, limited or restricted    Changing and Maintaining Body Position Goal Status (U0454(G8982)  At least 1 percent but less than 20 percent impaired,  limited or restricted       Problem List There are no active problems to display for this patient.   Myrene GalasWesley Samara Stankowski, PT DPT 11/02/2017, 12:21 PM  [place] [facility] [address] [place], [place], [address] Phone: [phone]   Fax:  [phone]  Name: Benjamin BernDavid L Cortner MRN: [id number] Date of Birth: 12/07/1945

## 2017-11-03 ENCOUNTER — Ambulatory Visit: Payer: Medicare HMO

## 2017-11-03 DIAGNOSIS — M79642 Pain in left hand: Secondary | ICD-10-CM

## 2017-11-03 DIAGNOSIS — M542 Cervicalgia: Secondary | ICD-10-CM

## 2017-11-03 DIAGNOSIS — M79641 Pain in right hand: Secondary | ICD-10-CM

## 2017-11-03 NOTE — Therapy (Signed)
[place] [facility] [address] , [place], [address] Phone: [phone]   Fax:  [phone]  Physical Therapy Treatment  Patient Details  Name: Benjamin BernDavid L [name] MRN: [id number] Date of Birth: 05-11-1946 Referring Provider: Dr. [place] BlasMoodey   Encounter Date: 11/03/2017  PT End of Session - 11/03/17 1043    Visit Number  21    Number of Visits  29    Date for PT Re-Evaluation  11/30/17    PT Start Time  0800    PT Stop Time  0900    PT Time Calculation (min)  60 min    Behavior During Therapy  [facility] assessed/performed       History reviewed. No pertinent past medical history.  History reviewed. No pertinent surgical history.  There were no vitals filed for this visit.  Subjective Assessment - 11/03/17 1041    Subjective  Pt reports no further severe, shocking type pain. Continues with some temporary aggravating symptoms with increased pressure in hands. No pain currently, as it is early in the day.       Pt enters/exits via steps Participates in the followng  Ambulation with good posture - 4 L fwd - 4 L side  Abdominal/scapular stab with UE exercises,25x - sh flex/ext - sh abd/add - sh horiz abd/add - arm circles F/B - bkwd sh rolls, focus on retr and depression - figure 8's  Abdominal stab with UE strength exercises, green dumbbells, 25x - sh flex/ext - sh abd/add - triceps press down  Scapular stab with overhead ball lift, 20x  Railing military push up, 20x  Planks 3 x 45 sec each - Fwd - B side  Stretching, 3 x 15 sec each - up trap - cervical rotation - scalenes                        PT Education - 11/03/17 1042    Education provided  Yes    Education Details  Modified military push up at rail          PT Long Term Goals - 11/03/17 1047      PT LONG TERM GOAL #1   Title  Patient will be independent with HEP performance to continue benefit of therapy after  discharge    Baseline  Dependent with exercise performance and technique; 09/21/17: Moderate cueing for exercises; 11/02/17: moderate cueing for positioning    Period  Weeks    Status  On-going      PT LONG TERM GOAL #2   Title  Patient will improve NDI to 0% to improve self- perceived neck dysfunction and decrease neck pain with activity.    Baseline  NDI: 6%; 09/21/17: 10%; 11/02/17: 28%    Time  6    Period  Weeks    Status  On-going      PT LONG TERM GOAL #3   Title  Patient will have a worst pain of 0/10 in his neck and hands to improve resting comfort at home most notably at the end of the day.    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     Baseline  increased pain after 30min of driving    Time  6    Period  Weeks    Status  New            Plan - 11/03/17 1043    Clinical Impression Statement  Pt continues to have difficulty finding proper shoulder/scapular stabilization positioning with exercise and functional activity requiring verbal cueing, sometimes at length. Pt has much improved this positioning when attained and able to maintain for longer periods, but continues to require supervision with correction cues to coordinate scap stabilization and especially with abdominal stabilization for proper form.     Rehab Potential  Fair    Clinical Impairments Affecting Rehab Potential  (+) highly motivated, (-) chronicity of pain    PT Frequency  2x / week    PT Duration  6 weeks    PT Treatment/Interventions  Gait training;Electrical Stimulation;Iontophoresis 4mg /ml Dexamethasone;ADLs/Self Care Home Management;Ultrasound;Moist Heat;Balance training;Therapeutic exercise;Fluidtherapy;Therapeutic  activities;Functional mobility training;Stair training;Neuromuscular re-education;Patient/family education;Passive range of motion;Manual techniques;Dry needling    PT Next Visit Plan  Progress strengthening and AROM       Patient will benefit from skilled therapeutic intervention in order to improve the following deficits and impairments:  Pain, Decreased mobility, Decreased coordination, Increased muscle spasms, Decreased endurance, Decreased range of motion, Decreased strength, Decreased activity tolerance, Decreased balance, Hypomobility  Visit Diagnosis: Cervicalgia  Pain in right hand  Pain in left hand   G-Codes - 11/02/17 1208    Functional Assessment Tool Used (Outpatient Only)  NDI, AROM, MMT     Functional Limitation  Changing and maintaining body position    Changing and Maintaining Body Position Current Status ([id number])  At least 1 percent but less than 20 percent impaired, limited or restricted    Changing and Maintaining Body Position Goal Status (W2956(G8982)  At least 1 percent but less than 20 percent impaired, limited or restricted       Problem List There are no active problems to display for this patient.   Scot DockHeidi E Barnes 11/03/2017, 10:48 AM  [place] [facility] [address] [place], [place], [address] Phone: [phone]   Fax:  [phone]  Name: Benjamin BernDavid L [name] MRN: [id number] Date of Birth: December 09, 1945

## 2017-11-09 ENCOUNTER — Ambulatory Visit: Payer: Medicare HMO

## 2017-11-10 ENCOUNTER — Ambulatory Visit: Payer: Medicare HMO

## 2017-11-16 ENCOUNTER — Ambulatory Visit: Payer: Medicare HMO

## 2017-11-16 DIAGNOSIS — M542 Cervicalgia: Secondary | ICD-10-CM | POA: Diagnosis not present

## 2017-11-16 DIAGNOSIS — M79641 Pain in right hand: Secondary | ICD-10-CM

## 2017-11-16 DIAGNOSIS — M79642 Pain in left hand: Secondary | ICD-10-CM

## 2017-11-16 NOTE — Therapy (Signed)
[place] [facility] [address] [place], [place], [address] Phone: [phone]   Fax:  [phone]  Physical Therapy Treatment  Patient Details  Name: Benjamin [name] MRN: [id number] Date of Birth: 01/13/1946 Referring Provider: Dr. [place] BlasMoodey   Encounter Date: 11/16/2017  PT End of Session - 11/16/17 0816    Visit Number  22    Number of Visits  29    Date for PT Re-Evaluation  11/30/17    Authorization Type  1 / 10 G Code    PT Start Time  0730    PT Stop Time  0815    PT Time Calculation (min)  45 min    Behavior During Therapy  [facility] for tasks assessed/performed       History reviewed. No pertinent past medical history.  History reviewed. No pertinent surgical history.  There were no vitals filed for this visit.  Subjective Assessment - 11/16/17 0810    Subjective  Pt reports increased pain along his L knee. Patient states it hurts him at night when he is about to go to sleep. Patient reports his neck pain and radiiating symptoms have been decreased and can drive longer without increase in symptoms    Pertinent History  PMH: x3 mitral valve replacement, history of anemia,     How long can you sit comfortably?  Driving: increase in pain after driving 50 miles    Diagnostic tests  X-Ray: shortening of spaces    Currently in Pain?  No/denies       TREATMENT:   Therapeutic Exercise: Supine retraction "T" formation - 2 x 20 Ulnar nerve, median nerve flossing - x 10 reps 3 sec holds  Long arc quads - x 15 in long sitting with YTB  Straight leg raise - x15  Seated thoracic extension against towel - 2x 20  TRX rows - 2 x 20  Wall angels with YTB and back against wall - 2 x 10 Thoracic extension in chair against towel - 2 x 20  Manual Therapy Thoracic mobs: grade III 2x30sec each segment C7-T8 centrally to improve mobilization and movement STM to patient's upper trap on the R side to decrease increased spasms and pain  with pt positioned in prone   Patient demonstrates no increase in pain at end of therapy   PT Education - 11/16/17 0815    Education provided  Yes    Education Details  HEP: LAQ, SLR    Person(s) Educated  Patient    Methods  Explanation;Demonstration    Comprehension  Verbalized understanding;Returned demonstration          PT Long Term Goals - 11/03/17 1047      PT LONG TERM GOAL #1   Title  Patient will be independent with HEP performance to continue benefit of therapy after discharge    Baseline  Dependent with exercise performance and technique; 09/21/17: Moderate cueing for exercises; 11/02/17: moderate cueing for positioning    Period  Weeks    Status  On-going      PT LONG TERM GOAL #2   Title  Patient will improve NDI to 0% to improve self- perceived neck dysfunction and decrease neck pain with activity.    Baseline  NDI: 6%; 09/21/17: 10%; 11/02/17: 28%    Time  6    Period  Weeks    Status  On-going      PT LONG TERM GOAL #3   Title  Patient will  have a worst pain of 0/10 in his neck and hands to improve resting comfort at home most notably at the end of the day.    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     Baseline  increased pain after 30min of driving    Time  6    Period  Weeks    Status  New            Plan - 11/16/17 0925    Clinical Impression Statement  Patient demonstrates improvement in resting pain and symptoms and tends to get pain with sustained positioning. Focused on improving thoracic mobility and hypomobility which P-A pressured caused recreation of symptoms with pressing along T1 centrally indicating joint dysfunction. Patient will benefit from further skilled therapy  to return to prior level of function.      Rehab Potential  Fair    Clinical Impairments Affecting Rehab Potential  (+) highly motivated, (-) chronicity of pain    PT Frequency  2x / week    PT Duration  6 weeks    PT Treatment/Interventions  Gait training;Electrical Stimulation;Iontophoresis 4mg /ml Dexamethasone;ADLs/Self Care Home Management;Ultrasound;Moist Heat;Balance training;Therapeutic exercise;Fluidtherapy;Therapeutic activities;Functional mobility training;Stair training;Neuromuscular re-education;Patient/family education;Passive range of motion;Manual techniques;Dry needling    PT Next Visit Plan  Progress strengthening and AROM       Patient will benefit from skilled therapeutic intervention in order to improve the following deficits and impairments:  Pain, Decreased mobility, Decreased coordination, Increased muscle spasms, Decreased endurance, Decreased range of motion, Decreased strength, Decreased activity tolerance, Decreased balance, Hypomobility  Visit Diagnosis: Cervicalgia  Pain in right hand  Pain in left hand     Problem List There are no active problems to display for this patient.   Myrene GalasWesley Chicquita Mendel, PT DPT 11/16/2017, 9:30 AM  [place] [facility] [address] [place], [place], [address] Phone: [phone]   Fax:  [phone]  Name: Benjamin [name] MRN: [id number] Date of Birth: 25-Mar-1946

## 2017-11-17 ENCOUNTER — Ambulatory Visit: Payer: Medicare HMO

## 2017-11-17 DIAGNOSIS — M542 Cervicalgia: Secondary | ICD-10-CM | POA: Diagnosis not present

## 2017-11-17 DIAGNOSIS — M79642 Pain in left hand: Secondary | ICD-10-CM

## 2017-11-17 DIAGNOSIS — M79641 Pain in right hand: Secondary | ICD-10-CM

## 2017-11-17 NOTE — Therapy (Signed)
[place] [facility] [address] [place], [place], [address] Phone: [phone]   Fax:  [phone]  Physical Therapy Treatment  Patient Details  Name: Benjamin [name] MRN: [id number] Date of Birth: Jan 11, 1946 Referring Provider: Dr. [place] BlasMoodey   Encounter Date: 11/17/2017  PT End of Session - 11/17/17 1337    Visit Number  23    Number of Visits  29    Date for PT Re-Evaluation  11/30/17    PT Start Time  0815    PT Stop Time  0905    PT Time Calculation (min)  50 min       History reviewed. No pertinent past medical history.  History reviewed. No pertinent surgical history.  There were no vitals filed for this visit.  Subjective Assessment - 11/17/17 1334    Subjective  Pt reports L knee pain due to shoveling ice/snow; notes PT has addressed. Monitored during aquatic session; no issues during session. Pt notes no further episodes of electrical shock type pain in upper extremities; compliant with HEP and wishing any other land exercises or progression of aquatic exercises to land environment. Denies pain/numbness/tingling currently in BUEs     Pt enters/exits pool via ladder Participates in the following  Ambulation, with arm swing - 4L fwd - 4L side  Core stabilization/sh/scap stabilization - with dumbells, 25x   - triceps press downs   - flex/ext   - abd/add - with speed for resistance, 25x   - sh flex/ext    - sh abd/add   - sh horiz abd/add - Primary focus sh/scap stabilization, 25x   - ball lift   - double bkwd sh rolls   - fwd/bkwd small arm circles   - figure 8's  Planks, 3 x 30 sec - Fwd forearm - Side starfish on forearm  Due to time constraints with stretch later                         PT Education - 11/17/17 1337    Education provided  Yes    Education Details  forearm plank fwd/side and side plank with progression to star technique    Person(s) Educated  Patient           PT Long Term Goals - 11/17/17 1341      PT LONG TERM GOAL #1   Title  Patient will be independent with HEP performance to continue benefit of therapy after discharge    Baseline  Dependent with exercise performance and technique; 09/21/17: Moderate cueing for exercises; 11/02/17: moderate cueing for positioning    Period  Weeks    Status  On-going      PT LONG TERM GOAL #2   Title  Patient will improve NDI to 0% to improve self- perceived neck dysfunction and decrease neck pain with activity.    Baseline  NDI: 6%; 09/21/17: 10%; 11/02/17: 28%    Time  6    Period  Weeks    Status  On-going      PT LONG TERM GOAL #3   Title  Patient will have a worst pain of 0/10 in his neck and hands to improve resting comfort at home most notably at the end of the day.    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     Baseline  increased pain after 30min of driving    Time  6    Period  Weeks    Status  New            Plan - 11/17/17 1338    Clinical Impression Statement  Plan to discuss progression of HEP with primary PT to progress aquatic exercises to land environment particularly core/sh/scap stabilization exercises to on physioball and Modified fwd/side planks.     Rehab Potential  Fair    Clinical Impairments Affecting Rehab Potential  (+) highly motivated, (-) chronicity of pain    PT Frequency  2x / week    PT Duration  6 weeks    PT Treatment/Interventions  Gait training;Electrical Stimulation;Iontophoresis 4mg /ml Dexamethasone;ADLs/Self Care Home Management;Ultrasound;Moist Heat;Balance training;Therapeutic exercise;Fluidtherapy;Therapeutic activities;Functional mobility training;Stair  training;Neuromuscular re-education;Patient/family education;Passive range of motion;Manual techniques;Dry needling    PT Next Visit Plan  Progress strengthening and AROM       Patient will benefit from skilled therapeutic intervention in order to improve the following deficits and impairments:  Pain, Decreased mobility, Decreased coordination, Increased muscle spasms, Decreased endurance, Decreased range of motion, Decreased strength, Decreased activity tolerance, Decreased balance, Hypomobility  Visit Diagnosis: Cervicalgia  Pain in right hand  Pain in left hand     Problem List There are no active problems to display for this patient.   [name] 11/17/2017, 1:42 PM  [place] [facility] [address] [place], [place], [address] Phone: [phone]   Fax:  [phone]  Name: Benjamin [name] MRN: [id number] Date of Birth: 1946-07-19

## 2017-11-30 ENCOUNTER — Ambulatory Visit: Payer: Medicare HMO

## 2017-11-30 DIAGNOSIS — M79641 Pain in right hand: Secondary | ICD-10-CM

## 2017-11-30 DIAGNOSIS — M542 Cervicalgia: Secondary | ICD-10-CM

## 2017-11-30 DIAGNOSIS — M79642 Pain in left hand: Secondary | ICD-10-CM

## 2017-11-30 NOTE — Therapy (Signed)
[place] [facility] [address] [place], [address] Phone: [phone]   Fax:  [phone]  Physical Therapy Treatment  Patient Details  Name: Benjamin [name] MRN: [id number] Date of Birth: 1946/02/06 Referring Provider: Lutricia Horsfall, MD   Encounter Date: 11/30/2017  PT End of Session - 11/30/17 0846    Visit Number  23    Number of Visits  37    Date for PT Re-Evaluation  12/31/17    Authorization Type  3/10 G code    PT Start Time  0846    PT Stop Time  0932    PT Time Calculation (min)  46 min    Activity Tolerance  Patient tolerated treatment well    Behavior During Therapy  [facility] for tasks assessed/performed       No past medical history on file.  No past surgical history on file.  There were no vitals filed for this visit.  Subjective Assessment - 11/30/17 0848    Subjective  C5-7 were in deteriorating shape. Puts voltaren at shoulder blades and exercises have been really helpful.  Neck is 0/10 at worst and hands are 2/10 at worst for the past 7 days.  Able to drive about 1.5 hours without pain. Performs chin tucks and keeps his hands down instead of up when driving. Pt states that he would like to continue PT until his hands gets better and more HEP.      Currently in Pain?  Yes    Pain Score  2  L thumb numbness         [facility] PT Assessment - 11/30/17 1231      Assessment   Referring Provider  Lutricia Horsfall, MD      Observation/Other Assessments   Neck Disability Index   16%                          PT Education - 11/30/17 0856    Education provided  Yes    Education Details  ther-ex, plan of care    Person(s) Educated  Patient    Methods  Explanation;Demonstration;Tactile cues;Verbal cues    Comprehension  Returned demonstration;Verbalized understanding       Objectives.   L knee is better but still has discomfort L medial knee. 1/10 currently.   Therapeutic  Exercise:  Reviewed progress with PT.   Reviewed plan of care: continue 2x/week for 4 weeks (next MD appointment with rheumatoloigist is around 12/31/2017)   Supine retraction "T" formation - 2 x 20. Decreased L thumb numbness by half per pt.   Ulnar nerve, median nerve flossing - x 10 reps   Seated hip adduction small physioball and glute max squeeze 10x5 seconds for 3 sets. Decreased knee pain. Reviewed and given as part of his HEP. Pt demonstrated and verbalized understanding.   Seated thoracic extension against towel - 2x 20   TRX rows - 2 x 20   Standing back against wall - wall angels - 2 x 12   Standing back against wall - wall angels - 2 x 12   Standing retraction at [place] - 2 x 20 20#   Improved exercise technique, movement at target joints, use of target muscles after min to mod verbal, visual, tactile cues.   Manual therapy  STM R upper trap and R rhomboid muscles  in sitting. Decreased L thumb numbness to barely being there afterwards  per pt.   Pt demonstrates overall decreased neck and bilateral hand pain as well as improved ability to drive for longer periods with less symptoms and improved function since last measured. Per pt, he feels like he is making progress with PT towards his goals. Pt still demonstrates bilateral hand symptoms and difficulty performing functional tasks and would benefit from continued skilled PT servies to address the aforementioned deficits.      PT Long Term Goals - 11/30/17 1236      PT LONG TERM GOAL #1   Title  Patient will be independent with HEP performance to continue benefit of therapy after discharge    Baseline  Dependent with exercise performance and technique; 09/21/17: Moderate cueing for exercises; 11/02/17: moderate cueing for positioning    Period  Weeks    Status  On-going    Target Date  12/31/17      PT LONG TERM GOAL #2   Title  Patient will improve NDI to 0% to improve self- perceived neck dysfunction and decrease  neck pain with activity.    Baseline  NDI: 6%; 09/21/17: 10%; 11/02/17: 28%; 16% (11/30/2017)    Time  6    Period  Weeks    Status  On-going    Target Date  12/31/17      PT LONG TERM GOAL #3   Title  Patient will have a worst pain of 0/10 in his neck and hands to improve resting comfort at home most notably at the end of the day.    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     Baseline  increased pain after 72mn of driving; able to drive aour about 1.5 hours without pain (11/30/2017)    Time  4    Period  Weeks    Status  Partially Met    Target Date  12/31/17            Plan - 11/30/17 0857    Clinical Impression Statement  Pt demonstrates overall decreased neck and bilateral hand pain as well as improved ability to drive for longer periods with less symptoms and improved function since last measured. Per pt, he feels like he is making progress with PT towards his goals. Pt still demonstrates bilateral hand symptoms and difficulty performing functional tasks and would benefit from continued skilled PT servies to address the aforementioned deficits.     History and Personal Factors relevant to plan of care:  Chronic pain, radiating pain, increased demand of driving    Clinical Presentation  Stable    Clinical Presentation due to:  decreased overall pain level, and improved ability to drive for longer duration    Clinical Decision Making  Low    Rehab Potential  Fair    Clinical Impairments Affecting Rehab Potential  (+) highly motivated, (-) chronicity of pain    PT Frequency  2x / week    PT Duration  4  weeks    PT Treatment/Interventions  Gait training;Electrical Stimulation;Iontophoresis 91m/ml Dexamethasone;ADLs/Self Care Home Management;Ultrasound;Moist Heat;Balance training;Therapeutic exercise;Fluidtherapy;Therapeutic activities;Functional mobility training;Stair training;Neuromuscular re-education;Patient/family education;Passive range of motion;Manual techniques;Dry needling    PT Next Visit Plan  Progress strengthening and AROM    Consulted and Agree with Plan of Care  Patient       Patient will benefit from skilled therapeutic intervention in order to improve the following deficits and impairments:  Pain, Decreased mobility, Decreased coordination, Increased muscle spasms, Decreased endurance, Decreased range of motion, Decreased strength, Decreased activity tolerance, Decreased balance, Hypomobility  Visit Diagnosis: Cervicalgia - Plan: PT plan of care cert/re-cert  Pain in right hand - Plan: PT plan of care cert/re-cert  Pain in left hand - Plan: PT plan of care cert/re-cert     Problem List There are no active problems to display for this patient.   MJoneen BoersPT, DPT   11/30/2017, 12:57 PM  [facility] [address] [place] [address]Phone: [phone]  Fax:  [phone] Name: Benjamin JABLONMRN: [id number] of Birth: 1Feb 05, 1947

## 2017-12-03 ENCOUNTER — Ambulatory Visit: Payer: Medicare HMO

## 2017-12-08 ENCOUNTER — Ambulatory Visit: Payer: Medicare HMO

## 2017-12-09 ENCOUNTER — Ambulatory Visit: Payer: Medicare HMO

## 2017-12-15 ENCOUNTER — Ambulatory Visit: Payer: Medicare HMO | Attending: Internal Medicine

## 2017-12-15 ENCOUNTER — Other Ambulatory Visit: Payer: Self-pay

## 2017-12-15 DIAGNOSIS — M542 Cervicalgia: Secondary | ICD-10-CM | POA: Insufficient documentation

## 2017-12-15 DIAGNOSIS — M79641 Pain in right hand: Secondary | ICD-10-CM | POA: Diagnosis present

## 2017-12-15 DIAGNOSIS — M79642 Pain in left hand: Secondary | ICD-10-CM | POA: Diagnosis present

## 2017-12-15 NOTE — Therapy (Signed)
[place] MAIN [facility] SERVICES [address] [place], [place], [address] Phone: [phone]   Fax:  [phone]  Physical Therapy Treatment  Patient Details  Name: GERROD MAULE MRN: [id number] Date of Birth: [birth year] Referring Provider: Lutricia Horsfall, MD   Encounter Date: 12/15/2017  PT End of Session - 12/15/17 1426    Visit Number  24    Number of Visits  37    Date for PT Re-Evaluation  12/31/17    PT Start Time  9470    PT Stop Time  1100    PT Time Calculation (min)  45 min    Activity Tolerance  Patient tolerated treatment well    Behavior During Therapy  [facility] for tasks assessed/performed       History reviewed. No pertinent past medical history.  History reviewed. No pertinent surgical history.  There were no vitals filed for this visit.  Subjective Assessment - 12/15/17 1421    Subjective  Pt reports episode in R posterior, upper arm (triceps region) feeling like a wasp sting when performing scap retraction movement almost 2 weeks ago. Sensation had continued and unable to perform movement for days. Pt has slowly been able to return to most exercises, but not all.       Ambulation with kick boards this session for upper back muscle relaxation - 4 L fwd - 4 L side  Scapular stabilization at wall, 20x ea - bkwd shoulder rolls - horiz ABD/ADD - B single arm figure 8s - Triceps press downs with dumbbells, 10x (mildly symptomatic R);   deferred futher  Planks, from hands, 3 x 30 sec ea - Fwd - Side, B  Military push ups at rail, 2 x 10   Stretching, B 3 x 15 sec ea - Up trap/scalenes - Posterior capsule/mid trap                        PT Education - 12/15/17 1424    Education provided  Yes    Education Details  Education on possibility of nerver irritation; some inflammatory process with nerve or muscle around nerve. Educated on rest versus return to activity and how to modify and slowly return  to activity using symptoms as guide.           PT Long Term Goals - 12/15/17 1428      PT LONG TERM GOAL #1   Title  Patient will be independent with HEP performance to continue benefit of therapy after discharge    Baseline  Dependent with exercise performance and technique; 09/21/17: Moderate cueing for exercises; 11/02/17: moderate cueing for positioning    Period  Weeks    Status  On-going      PT LONG TERM GOAL #2   Title  Patient will improve NDI to 0% to improve self- perceived neck dysfunction and decrease neck pain with activity.    Baseline  NDI: 6%; 09/21/17: 10%; 11/02/17: 28%; 16% (11/30/2017)    Time  6    Period  Weeks    Status  On-going      PT LONG TERM GOAL #3   Title  Patient will have a worst pain of 0/10 in his neck and hands to improve resting comfort at home most notably at the end of the day.    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     Baseline  increased pain after 21mn of driving; able to drive aour about 1.5 hours without pain (11/30/2017)    Time  4    Period  Weeks    Status  Partially Met            Plan - 12/15/17 1426    Clinical Impression Statement  Pt able to perform most activities with mild modification if needed. Avoided heavier use of sh/scapular retraction. Added posterior capsule/mid trap stretch    Rehab Potential  Fair    Clinical Impairments Affecting Rehab Potential  (+) highly motivated, (-) chronicity of pain    PT Frequency  2x / week    PT Duration  4 weeks    PT Treatment/Interventions  Gait training;Electrical Stimulation;Iontophoresis 425mml Dexamethasone;ADLs/Self Care  Home Management;Ultrasound;Moist Heat;Balance training;Therapeutic exercise;Fluidtherapy;Therapeutic activities;Functional mobility training;Stair training;Neuromuscular re-education;Patient/family education;Passive range of motion;Manual techniques;Dry needling    PT Next Visit Plan  Progress strengthening and AROM    Consulted and Agree with Plan of Care  Patient       Patient will benefit from skilled therapeutic intervention in order to improve the following deficits and impairments:  Pain, Decreased mobility, Decreased coordination, Increased muscle spasms, Decreased endurance, Decreased range of motion, Decreased strength, Decreased activity tolerance, Decreased balance, Hypomobility  Visit Diagnosis: Cervicalgia  Pain in right hand  Pain in left hand     Problem List There are no active problems to display for this patient.   HeLarae Grooms/15/2019, 2:29 PM  [place] RECar[facility] HospitalERVICES [address]: [phone] Fax:  [phone]: DaAQIB LOUGHRN: [id number] of Birth: [birth year]

## 2017-12-22 ENCOUNTER — Other Ambulatory Visit: Payer: Self-pay | Admitting: Family Medicine

## 2017-12-22 ENCOUNTER — Ambulatory Visit: Payer: Medicare HMO

## 2017-12-22 DIAGNOSIS — R1013 Epigastric pain: Secondary | ICD-10-CM

## 2017-12-22 DIAGNOSIS — R17 Unspecified jaundice: Secondary | ICD-10-CM

## 2017-12-24 ENCOUNTER — Ambulatory Visit: Payer: Medicare HMO

## 2017-12-29 ENCOUNTER — Ambulatory Visit: Payer: Medicare HMO
